# Patient Record
Sex: Male | Born: 1980 | Race: Black or African American | Hispanic: No | Marital: Single | Smoking: Current every day smoker
Health system: Southern US, Community
[De-identification: ages and names within clinical notes are randomized; demographics above are authoritative.]

---

## 2002-07-12 ENCOUNTER — Emergency Department (HOSPITAL_COMMUNITY): Admission: EM | Admit: 2002-07-12 | Discharge: 2002-07-12 | Payer: Self-pay | Admitting: Emergency Medicine

## 2002-07-12 ENCOUNTER — Encounter: Payer: Self-pay | Admitting: Emergency Medicine

## 2006-01-14 ENCOUNTER — Emergency Department (HOSPITAL_COMMUNITY): Admission: EM | Admit: 2006-01-14 | Discharge: 2006-01-14 | Payer: Self-pay | Admitting: Family Medicine

## 2007-02-27 ENCOUNTER — Emergency Department (HOSPITAL_COMMUNITY): Admission: EM | Admit: 2007-02-27 | Discharge: 2007-02-27 | Payer: Self-pay | Admitting: Emergency Medicine

## 2007-03-31 ENCOUNTER — Emergency Department (HOSPITAL_COMMUNITY): Admission: EM | Admit: 2007-03-31 | Discharge: 2007-04-01 | Payer: Self-pay | Admitting: Emergency Medicine

## 2007-04-09 ENCOUNTER — Emergency Department (HOSPITAL_COMMUNITY): Admission: EM | Admit: 2007-04-09 | Discharge: 2007-04-09 | Payer: Self-pay | Admitting: Family Medicine

## 2008-09-30 ENCOUNTER — Emergency Department (HOSPITAL_COMMUNITY): Admission: EM | Admit: 2008-09-30 | Discharge: 2008-09-30 | Payer: Self-pay | Admitting: Emergency Medicine

## 2009-02-10 ENCOUNTER — Emergency Department (HOSPITAL_COMMUNITY): Admission: EM | Admit: 2009-02-10 | Discharge: 2009-02-10 | Payer: Self-pay | Admitting: Emergency Medicine

## 2010-11-17 ENCOUNTER — Ambulatory Visit (INDEPENDENT_AMBULATORY_CARE_PROVIDER_SITE_OTHER): Payer: No Typology Code available for payment source

## 2010-11-17 ENCOUNTER — Observation Stay (HOSPITAL_COMMUNITY)
Admission: EM | Admit: 2010-11-17 | Discharge: 2010-11-19 | Disposition: A | Discharge status: Home / Self Care | Payer: No Typology Code available for payment source | Attending: Orthopedic Surgery | Admitting: Orthopedic Surgery

## 2010-11-17 ENCOUNTER — Inpatient Hospital Stay (INDEPENDENT_AMBULATORY_CARE_PROVIDER_SITE_OTHER)
Admission: RE | Admit: 2010-11-17 | Discharge: 2010-11-17 | Disposition: A | Discharge status: Home / Self Care | Payer: No Typology Code available for payment source | Source: Ambulatory Visit | Attending: Family Medicine | Admitting: Family Medicine

## 2010-11-17 DIAGNOSIS — S63269A Dislocation of metacarpophalangeal joint of unspecified finger, initial encounter: Secondary | ICD-10-CM

## 2010-11-17 DIAGNOSIS — Y998 Other external cause status: Secondary | ICD-10-CM | POA: Insufficient documentation

## 2010-11-17 DIAGNOSIS — F17200 Nicotine dependence, unspecified, uncomplicated: Secondary | ICD-10-CM | POA: Insufficient documentation

## 2010-11-17 DIAGNOSIS — IMO0002 Reserved for concepts with insufficient information to code with codable children: Principal | ICD-10-CM | POA: Insufficient documentation

## 2010-11-17 DIAGNOSIS — F1210 Cannabis abuse, uncomplicated: Secondary | ICD-10-CM | POA: Insufficient documentation

## 2010-11-17 LAB — POCT I-STAT, CHEM 8
Creatinine, Ser: 1.4 mg/dL (ref 0.4–1.5)
Glucose, Bld: 44 mg/dL — CL (ref 70–99)
Hemoglobin: 15 g/dL (ref 13.0–17.0)
Potassium: 3.4 mEq/L — ABNORMAL LOW (ref 3.5–5.1)

## 2010-11-17 LAB — GLUCOSE, CAPILLARY

## 2010-11-18 LAB — BASIC METABOLIC PANEL
BUN: 8 mg/dL (ref 6–23)
Chloride: 104 mEq/L (ref 96–112)
GFR calc Af Amer: >60 mL/min (ref >60)
Potassium: 3.9 mEq/L (ref 3.5–5.1)
Sodium: 138 mEq/L (ref 135–145)

## 2010-11-18 LAB — GLUCOSE, CAPILLARY
Glucose-Capillary: 81 mg/dL (ref 70–99)
Glucose-Capillary: 103 mg/dL — ABNORMAL HIGH (ref 70–99)
Glucose-Capillary: 114 mg/dL — ABNORMAL HIGH (ref 70–99)

## 2010-11-18 LAB — CBC
MCV: 90.8 fL (ref 78.0–100.0)
Platelets: 247 10*3/uL (ref 150–400)
RBC: 3.82 MIL/uL — ABNORMAL LOW (ref 4.22–5.81)
WBC: 7.8 10*3/uL (ref 4.0–10.5)
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
## 2010-11-27 ENCOUNTER — Ambulatory Visit: Payer: No Typology Code available for payment source | Attending: Orthopedic Surgery | Admitting: Occupational Therapy

## 2010-11-27 DIAGNOSIS — IMO0001 Reserved for inherently not codable concepts without codable children: Secondary | ICD-10-CM | POA: Insufficient documentation

## 2010-11-27 DIAGNOSIS — M25549 Pain in joints of unspecified hand: Secondary | ICD-10-CM | POA: Insufficient documentation

## 2010-11-27 DIAGNOSIS — M6281 Muscle weakness (generalized): Secondary | ICD-10-CM | POA: Insufficient documentation

## 2010-11-27 DIAGNOSIS — M2560 Stiffness of unspecified joint, not elsewhere classified: Secondary | ICD-10-CM | POA: Insufficient documentation

## 2010-11-28 ENCOUNTER — Ambulatory Visit: Payer: No Typology Code available for payment source | Admitting: Occupational Therapy

## 2010-12-04 ENCOUNTER — Encounter: Payer: No Typology Code available for payment source | Admitting: Occupational Therapy

## 2010-12-06 ENCOUNTER — Ambulatory Visit: Payer: No Typology Code available for payment source | Admitting: Occupational Therapy

## 2010-12-12 ENCOUNTER — Encounter: Payer: No Typology Code available for payment source | Admitting: Occupational Therapy

## 2010-12-14 ENCOUNTER — Encounter: Payer: No Typology Code available for payment source | Admitting: Occupational Therapy

## 2010-12-18 ENCOUNTER — Ambulatory Visit: Payer: Self-pay | Attending: Orthopedic Surgery | Admitting: Occupational Therapy

## 2010-12-18 DIAGNOSIS — M2560 Stiffness of unspecified joint, not elsewhere classified: Secondary | ICD-10-CM | POA: Insufficient documentation

## 2010-12-18 DIAGNOSIS — M25549 Pain in joints of unspecified hand: Secondary | ICD-10-CM | POA: Insufficient documentation

## 2010-12-18 DIAGNOSIS — M6281 Muscle weakness (generalized): Secondary | ICD-10-CM | POA: Insufficient documentation

## 2010-12-18 DIAGNOSIS — IMO0001 Reserved for inherently not codable concepts without codable children: Secondary | ICD-10-CM | POA: Insufficient documentation

## 2010-12-20 ENCOUNTER — Encounter: Payer: No Typology Code available for payment source | Admitting: Occupational Therapy

## 2010-12-20 ENCOUNTER — Ambulatory Visit: Payer: Self-pay | Admitting: Occupational Therapy

## 2010-12-25 ENCOUNTER — Ambulatory Visit: Payer: Self-pay | Admitting: Occupational Therapy

## 2010-12-26 ENCOUNTER — Encounter: Payer: No Typology Code available for payment source | Admitting: Occupational Therapy

## 2010-12-28 ENCOUNTER — Encounter: Payer: Self-pay | Admitting: Occupational Therapy

## 2010-12-28 ENCOUNTER — Encounter: Payer: No Typology Code available for payment source | Admitting: Occupational Therapy

## 2011-01-04 ENCOUNTER — Ambulatory Visit: Payer: Self-pay | Admitting: Occupational Therapy

## 2011-01-07 ENCOUNTER — Inpatient Hospital Stay (INDEPENDENT_AMBULATORY_CARE_PROVIDER_SITE_OTHER)
Admission: RE | Admit: 2011-01-07 | Discharge: 2011-01-07 | Disposition: A | Discharge status: Home / Self Care | Payer: Self-pay | Source: Ambulatory Visit | Attending: Family Medicine | Admitting: Family Medicine

## 2011-01-07 DIAGNOSIS — J029 Acute pharyngitis, unspecified: Secondary | ICD-10-CM

## 2011-01-11 ENCOUNTER — Encounter: Payer: Self-pay | Admitting: Occupational Therapy

## 2011-01-12 ENCOUNTER — Encounter: Payer: Self-pay | Admitting: Occupational Therapy

## 2011-01-15 ENCOUNTER — Encounter: Payer: Self-pay | Admitting: Occupational Therapy

## 2011-01-16 ENCOUNTER — Encounter: Payer: Self-pay | Admitting: Occupational Therapy
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
## 2014-06-08 ENCOUNTER — Telehealth: Payer: Self-pay | Admitting: Pulmonary Disease

## 2014-06-08 NOTE — Telephone Encounter (Signed)
error 

## 2014-10-13 ENCOUNTER — Encounter (HOSPITAL_COMMUNITY): Payer: Self-pay | Admitting: Emergency Medicine

## 2014-10-13 ENCOUNTER — Emergency Department (HOSPITAL_COMMUNITY)
Admission: EM | Admit: 2014-10-13 | Discharge: 2014-10-13 | Disposition: A | Discharge status: Home / Self Care | Payer: Self-pay | Attending: Emergency Medicine | Admitting: Emergency Medicine

## 2014-10-13 ENCOUNTER — Emergency Department (HOSPITAL_COMMUNITY): Payer: Self-pay

## 2014-10-13 DIAGNOSIS — K529 Noninfective gastroenteritis and colitis, unspecified: Secondary | ICD-10-CM | POA: Insufficient documentation

## 2014-10-13 DIAGNOSIS — Z720 Tobacco use: Secondary | ICD-10-CM | POA: Insufficient documentation

## 2014-10-13 LAB — CBC WITH DIFFERENTIAL/PLATELET
BASOS ABS: 0 10*3/uL (ref 0.0–0.1)
Basophils Relative: 0 % (ref 0–1)
EOS ABS: 0.1 10*3/uL (ref 0.0–0.7)
EOS PCT: 1 % (ref 0–5)
HEMATOCRIT: 38.2 % — AB (ref 39.0–52.0)
HEMOGLOBIN: 12.9 g/dL — AB (ref 13.0–17.0)
LYMPHS PCT: 22 % (ref 12–46)
Lymphs Abs: 2.1 10*3/uL (ref 0.7–4.0)
MCH: 31.2 pg (ref 26.0–34.0)
MCHC: 33.8 g/dL (ref 30.0–36.0)
MCV: 92.3 fL (ref 78.0–100.0)
MONO ABS: 1.2 10*3/uL — AB (ref 0.1–1.0)
MONOS PCT: 12 % (ref 3–12)
NEUTROS PCT: 65 % (ref 43–77)
Neutro Abs: 6.4 10*3/uL (ref 1.7–7.7)
PLATELETS: 238 10*3/uL (ref 150–400)
RBC: 4.14 MIL/uL — ABNORMAL LOW (ref 4.22–5.81)
RDW: 12.5 % (ref 11.5–15.5)
WBC: 9.8 10*3/uL (ref 4.0–10.5)

## 2014-10-13 LAB — COMPREHENSIVE METABOLIC PANEL
ALBUMIN: 4.1 g/dL (ref 3.5–5.2)
ALT: 12 U/L (ref 0–53)
AST: 18 U/L (ref 0–37)
Alkaline Phosphatase: 53 U/L (ref 39–117)
Anion gap: 9 (ref 5–15)
BUN: 7 mg/dL (ref 6–23)
CALCIUM: 9.1 mg/dL (ref 8.4–10.5)
CO2: 29 mmol/L (ref 19–32)
CREATININE: 1.19 mg/dL (ref 0.50–1.35)
Chloride: 98 mmol/L (ref 96–112)
GFR, EST NON AFRICAN AMERICAN: 79 mL/min — AB (ref >90)
Glucose, Bld: 101 mg/dL — ABNORMAL HIGH (ref 70–99)
Potassium: 3.7 mmol/L (ref 3.5–5.1)
SODIUM: 136 mmol/L (ref 135–145)
TOTAL PROTEIN: 7.5 g/dL (ref 6.0–8.3)
Total Bilirubin: 1.1 mg/dL (ref 0.3–1.2)

## 2014-10-13 LAB — URINALYSIS, ROUTINE W REFLEX MICROSCOPIC
GLUCOSE, UA: NEGATIVE mg/dL
HGB URINE DIPSTICK: NEGATIVE
Ketones, ur: 15 mg/dL — AB
Nitrite: NEGATIVE
PH: 5.5 (ref 5.0–8.0)
Protein, ur: 30 mg/dL — AB
SPECIFIC GRAVITY, URINE: 1.028 (ref 1.005–1.030)
UROBILINOGEN UA: 1 mg/dL (ref 0.0–1.0)

## 2014-10-13 LAB — LIPASE, BLOOD: Lipase: 19 U/L (ref 11–59)

## 2014-10-13 LAB — URINE MICROSCOPIC-ADD ON

## 2014-10-13 MED ORDER — METRONIDAZOLE 500 MG PO TABS: 500.0000 mg | ORAL_TABLET | Freq: Three times a day (TID) | ORAL | Status: DC

## 2014-10-13 MED ORDER — IOHEXOL 300 MG/ML  SOLN: 25.0000 mL | Freq: Once | INTRAMUSCULAR | Status: DC | PRN

## 2014-10-13 MED ORDER — ONDANSETRON HCL 4 MG/2ML IJ SOLN
4.0000 mg | Freq: Once | INTRAMUSCULAR | Status: AC
  Administered 2014-10-13: 4 mg via INTRAVENOUS
  Filled 2014-10-13 (×2): qty 2

## 2014-10-13 MED ORDER — CIPROFLOXACIN HCL 500 MG PO TABS
500.0000 mg | ORAL_TABLET | Freq: Once | ORAL | Status: AC
  Administered 2014-10-13: 500 mg via ORAL
  Filled 2014-10-13: qty 1

## 2014-10-13 MED ORDER — HYDROCODONE-ACETAMINOPHEN 5-325 MG PO TABS: 1.0000 | ORAL_TABLET | Freq: Four times a day (QID) | ORAL | Status: DC | PRN

## 2014-10-13 MED ORDER — MORPHINE SULFATE 4 MG/ML IJ SOLN
4.0000 mg | Freq: Once | INTRAMUSCULAR | Status: AC
  Administered 2014-10-13: 4 mg via INTRAVENOUS
  Filled 2014-10-13 (×2): qty 1

## 2014-10-13 MED ORDER — METRONIDAZOLE 500 MG PO TABS
500.0000 mg | ORAL_TABLET | Freq: Once | ORAL | Status: AC
  Administered 2014-10-13: 500 mg via ORAL
  Filled 2014-10-13: qty 1

## 2014-10-13 MED ORDER — CIPROFLOXACIN HCL 500 MG PO TABS: 500.0000 mg | ORAL_TABLET | Freq: Two times a day (BID) | ORAL | Status: DC

## 2014-10-13 MED ORDER — IOHEXOL 300 MG/ML  SOLN
80.0000 mL | Freq: Once | INTRAMUSCULAR | Status: AC | PRN
  Administered 2014-10-13: 1 mL via INTRAVENOUS

## 2014-10-13 NOTE — ED Notes (Addendum)
Pt states on Monday night he started having lower abd pain mostly of the left lower side, radiating into lower back that started yesterday. vomittied once this morning.pt also states he feels like he hasnt been urinating as much as he normally does. Only twice in the last two days.

## 2014-10-13 NOTE — ED Provider Notes (Signed)
CSN: [id number]     Arrival date & time 10/13/14  [id number] History   First MD Initiated Contact with Patient 10/13/14 [id number]     Chief Complaint  Patient presents with  . Abdominal Pain     (Consider location/radiation/quality/duration/timing/severity/associated sxs/prior Treatment) HPI Sergio Edwards is a 34 y.o. male with no medical problems presents to ED with complaint of abdominal pain. States pain started 2 days ago. Worsened last night. Pain in the left lower abdomen. Associated nausea, one episode of vomiting. Normal bowel movement yesterday. Normal urinations with no dysuria, hematuria, frequency or urgency. No testicular pain. Denies fever or chills. No prior abdominal surgeries. Took goodys powder which helped some yesterday. No prior similar pains.   History reviewed. No pertinent past medical history. History reviewed. No pertinent past surgical history. No family history on file. History  Substance Use Topics  . Smoking status: Current Every Day Smoker -- 0.50 packs/day    Types: Cigarettes  . Smokeless tobacco: Not on file  . Alcohol Use: 1.2 oz/week    2 Cans of beer per week    Review of Systems  Constitutional: Negative for fever and chills.  Respiratory: Negative for cough, chest tightness and shortness of breath.   Cardiovascular: Negative for chest pain, palpitations and leg swelling.  Gastrointestinal: Positive for nausea, vomiting and abdominal pain. Negative for diarrhea, constipation, blood in stool and abdominal distention.  Genitourinary: Negative for dysuria, urgency, frequency and hematuria.  Musculoskeletal: Negative for myalgias, arthralgias, neck pain and neck stiffness.  Skin: Negative for rash.  Allergic/Immunologic: Negative for immunocompromised state.  Neurological: Negative for dizziness, weakness, light-headedness, numbness and headaches.  All other systems reviewed and are negative.     Allergies  Review of patient's allergies indicates no  known allergies.  Home Medications   Prior to Admission medications   Not on File   BP 133/79 mmHg  Pulse 75  Temp(Src) 97.9 F (36.6 C) (Oral)  Resp 16  Ht 5\' 7"  (1.702 m)  Wt 135 lb (61.236 kg)  BMI 21.14 kg/m2  SpO2 98% Physical Exam  Constitutional: He is oriented to person, place, and time. He appears well-developed and well-nourished. No distress.  HENT:  Head: Normocephalic and atraumatic.  Eyes: Conjunctivae are normal.  Neck: Neck supple.  Cardiovascular: Normal rate, regular rhythm and normal heart sounds.   Pulmonary/Chest: Effort normal. No respiratory distress. He has no wheezes. He has no rales.  Abdominal: Soft. Bowel sounds are normal. He exhibits no distension and no mass. There is tenderness. There is no rebound.   Left lower quadrant tenderness with mild guarding  Musculoskeletal: He exhibits no edema.  Neurological: He is alert and oriented to person, place, and time.  Skin: Skin is warm and dry.  Nursing note and vitals reviewed.   ED Course  Procedures (including critical care time) Labs Review Labs Reviewed  CBC WITH DIFFERENTIAL/PLATELET - Abnormal; Notable for the following:    RBC 4.14 (*)    Hemoglobin 12.9 (*)    HCT 38.2 (*)    Monocytes Absolute 1.2 (*)    All other components within normal limits  COMPREHENSIVE METABOLIC PANEL - Abnormal; Notable for the following:    Glucose, Bld 101 (*)    GFR calc non Af Amer 79 (*)    All other components within normal limits  URINALYSIS, ROUTINE W REFLEX MICROSCOPIC - Abnormal; Notable for the following:    Color, Urine AMBER (*)    Bilirubin Urine SMALL (*)  Ketones, ur 15 (*)    Protein, ur 30 (*)    Leukocytes, UA TRACE (*)    All other components within normal limits  LIPASE, BLOOD  URINE MICROSCOPIC-ADD ON    Imaging Review Ct Abdomen Pelvis W Contrast  10/13/2014   CLINICAL DATA:  Left lower quadrant pain for 2 days  EXAM: CT ABDOMEN AND PELVIS WITH CONTRAST  TECHNIQUE:  Multidetector CT imaging of the abdomen and pelvis was performed using the standard protocol following bolus administration of intravenous contrast.  CONTRAST:  1mL OMNIPAQUE IOHEXOL 300 MG/ML  SOLN  COMPARISON:  None.  FINDINGS: Lung bases are unremarkable. Sagittal images of the spine are unremarkable.  Enhanced liver, pancreas, spleen and adrenal glands are unremarkable. No calcified gallstones are noted within gallbladder. Abdominal aorta is unremarkable. No aortic aneurysm.  Enhanced kidneys are symmetrical in size. No hydronephrosis or hydroureter.  Normal retrocecal appendix clearly visualize in axial image 60. No pericecal inflammation.  Few diverticula are noted in descending colon. In axial image 57 there is focal thickening or colonic wall in distal left colon medial aspect of the colon. There is small amount of pericolonic fluid and significant strand of pericolonic fat. Findings are consistent with significant focal diverticulitis or colitis. No definite extraluminal air is noted.  There is moderate gas in distal sigmoid colon and rectum. No distal colonic obstruction. Prostate gland and seminal vesicles are unremarkable. There is a low lying cecum.  IMPRESSION: 1. There is focal inflammatory process in distal left colon left lower quadrant with thickening of medial wall of the colon in left lower quadrant see axial image 57. There is small amount of pericolonic fluid and significant pericolonic stranding is noted. Findings are consistent with significant focal diverticulitis or colitis. No definite extraluminal air is noted to suggest a perforation. 2. Normal appendix. There is a low lying cecum. No pericecal inflammation. 3. Moderate gas noted in distal sigmoid colon and rectum. No distal colonic obstruction. 4. No small bowel obstruction. 5. No hydronephrosis or hydroureter.   Electronically Signed   By: Natasha Mead M.D.   On: 10/13/2014 12:28     EKG Interpretation None      MDM   Final  diagnoses:  Colitis     patient with abdominal pain  For 2 days. Pain mainly in the left lower quadrant. Abdomen is tender, some guarding noted on exam. Patient otherwise nontoxic appearing. Vital signs are normal. Will get blood tests, pain medications ordered.  12:39 PM  CT obtained due to guarding on exam and significant tenderness. CT as described above, showing focal diverticulitis versus colitis. Will place patient on Cipro Flagyl, Norco for pain at home. Follow-up with primary care doctor or with gastroenterology. Patient is tolerating by mouth fluids in emergency department. First does of medications given here. Patient is stable for outpatient treatment and follow-up, return precautions discussed.'  Filed Vitals:   10/13/14 1117 10/13/14 1130 10/13/14 1145 10/13/14 1305  BP: 110/65 116/77 121/79 112/70  Pulse: 75 87 70 84  Temp:    97.9 F (36.6 C)  TempSrc:    Oral  Resp: Height:      Weight:      SpO2: 99% 98% 98% 100%     Lottie Mussel, PA-C 10/13/14 1322  Gwyneth Sprout, MD 10/13/14 1512

## 2014-10-13 NOTE — Discharge Instructions (Signed)
cirpo and flagyl for infection until all gone. Norco for severe pain. Follow up with your family doctor or gastroenterology. Return if worsening symptoms.    Colitis Colitis is inflammation of the colon. Colitis can be a short-term or long-standing (chronic) illness. Crohn's disease and ulcerative colitis are 2 types of colitis which are chronic. They usually require lifelong treatment. CAUSES  There are many different causes of colitis, including:  Viruses.  Germs (bacteria).  Medicine reactions. SYMPTOMS   Diarrhea.  Intestinal bleeding.  Pain.  Fever.  Throwing up (vomiting).  Tiredness (fatigue).  Weight loss.  Bowel blockage. DIAGNOSIS  The diagnosis of colitis is based on examination and stool or blood tests. X-rays, CT scan, and colonoscopy may also be needed. TREATMENT  Treatment may include:  Fluids given through the vein (intravenously).  Bowel rest (nothing to eat or drink for a period of time).  Medicine for pain and diarrhea.  Medicines (antibiotics) that kill germs.  Cortisone medicines.  Surgery. HOME CARE INSTRUCTIONS   Get plenty of rest.  Drink enough water and fluids to keep your urine clear or pale yellow.  Eat a well-balanced diet.  Call your caregiver for follow-up as recommended. SEEK IMMEDIATE MEDICAL CARE IF:   You develop chills.  You have an oral temperature above 102 F (38.9 C), not controlled by medicine.  You have extreme weakness, fainting, or dehydration.  You have repeated vomiting.  You develop severe belly (abdominal) pain or are passing bloody or tarry stools. MAKE SURE YOU:   Understand these instructions.  Will watch your condition.  Will get help right away if you are not doing well or get worse. Document Released: 10/11/2004 Document Revised: 11/26/2011 Document Reviewed: 01/06/2010 [facility] Patient Information 2015 [employer] [place]. This information is not intended to replace advice given to you by  your health care provider. Make sure you discuss any questions you have with your health care provider.

## 2015-08-20 ENCOUNTER — Encounter (HOSPITAL_COMMUNITY): Payer: Self-pay

## 2015-08-20 ENCOUNTER — Emergency Department (HOSPITAL_COMMUNITY)
Admission: EM | Admit: 2015-08-20 | Discharge: 2015-08-20 | Disposition: A | Discharge status: Home / Self Care | Payer: PRIVATE HEALTH INSURANCE | Attending: Emergency Medicine | Admitting: Emergency Medicine

## 2015-08-20 ENCOUNTER — Emergency Department (HOSPITAL_COMMUNITY): Payer: PRIVATE HEALTH INSURANCE

## 2015-08-20 DIAGNOSIS — F17210 Nicotine dependence, cigarettes, uncomplicated: Secondary | ICD-10-CM | POA: Diagnosis not present

## 2015-08-20 DIAGNOSIS — Y9289 Other specified places as the place of occurrence of the external cause: Secondary | ICD-10-CM | POA: Diagnosis not present

## 2015-08-20 DIAGNOSIS — S81801A Unspecified open wound, right lower leg, initial encounter: Secondary | ICD-10-CM | POA: Insufficient documentation

## 2015-08-20 DIAGNOSIS — Y999 Unspecified external cause status: Secondary | ICD-10-CM | POA: Insufficient documentation

## 2015-08-20 DIAGNOSIS — Y9389 Activity, other specified: Secondary | ICD-10-CM | POA: Diagnosis not present

## 2015-08-20 DIAGNOSIS — Z23 Encounter for immunization: Secondary | ICD-10-CM | POA: Diagnosis not present

## 2015-08-20 DIAGNOSIS — S81831A Puncture wound without foreign body, right lower leg, initial encounter: Secondary | ICD-10-CM | POA: Diagnosis present

## 2015-08-20 DIAGNOSIS — S81811A Laceration without foreign body, right lower leg, initial encounter: Secondary | ICD-10-CM

## 2015-08-20 MED ORDER — TETANUS-DIPHTH-ACELL PERTUSSIS 5-2.5-18.5 LF-MCG/0.5 IM SUSP
INTRAMUSCULAR | Status: AC
  Filled 2015-08-20: qty 0.5

## 2015-08-20 MED ORDER — TETANUS-DIPHTH-ACELL PERTUSSIS 5-2.5-18.5 LF-MCG/0.5 IM SUSP
0.5000 mL | Freq: Once | INTRAMUSCULAR | Status: AC
  Administered 2015-08-20: 0.5 mL via INTRAMUSCULAR

## 2015-08-20 MED ORDER — IBUPROFEN 800 MG PO TABS: 800.0000 mg | ORAL_TABLET | Freq: Three times a day (TID) | ORAL | Status: DC

## 2015-08-20 MED ORDER — CEPHALEXIN 500 MG PO CAPS: 500.0000 mg | ORAL_CAPSULE | Freq: Three times a day (TID) | ORAL | Status: DC
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
## 2015-08-20 NOTE — ED Notes (Signed)
MD at bedside; Md preformed wound care and place 2 stables to right outside of right leg

## 2015-08-20 NOTE — Discharge Instructions (Signed)
Keflex daily for 10 days Laceration:  The laceration is a cut or lesion that goes through all layers of the skin and into the tissue just beneath the skin. This may have been repaired by your caregiver with either stitches or a tissue adhesive similar to a super glue.  Please keep your wound clean and dry with a topical antibiotic and a sterile dressing for the next 48 hours. Your wound should be reevaluated by your family doctor within the next 2 days for a recheck. If you do not have a family doctor you may return to the emergency department for a recheck or see the list of followup doctors below.  Seek medical attention if:   There is redness, swelling, increasing pain in the wound  There is a red line that goes up your arm or leg  Pus is coming from the wound  He developed an unexplained temperature above 100.13F  He noticed a foul-smelling coming from the wound or dressing  There is a breaking open of the wound after the sutures have been removed  If you did not receive a tetanus shot today because she thought she were up to date but did not recall when her last one was given, nature to check with her primary caregiver to determine if she needs one.   RESOURCE GUIDE  Dental Problems  Patients with Medicaid: [facility] [phone]                                           [phone] [address] [employer] [address] Phone:  [phone]                                                  Phone:  [phone]  If unable to pay or uninsured, contact:  [facility] or [facility]. to become qualified for the adult dental clinic.  Chronic Pain Problems Contact [facility]  [phone] Patients need to be referred by their primary care doctor.  Insufficient Money for Medicine Contact [employer]:  call "211" or [facility] 743-680-7132.  No Primary Care Doctor Call [facility]  ([phone] Other agencies that  provide inexpensive medical care    [facility]  7125662801    [facility]  [phone]    [facility]  [phone]    [facility]  [phone]    [facility]  [phone]    [facility]  7050567868  Psychological Services [facility]  [phone] [facility] Services  [phone] [facility]   [phone] (emergency services 516-729-4439)  Substance Abuse Resources [facility]  801-116-5282 [facility] (425)734-1725 [facility] 7693322588 Floydene Flock [phone] Residential & Outpatient Substance Abuse Program  (908) 090-8164  Abuse/Neglect [facility] Child Abuse Hotline 863-650-9853 [facility] Child Abuse Hotline 9844401037 (After Hours)  Emergency Shelter [facility] 501 735 0018  M[facility] [place] [phone] [facility] Services [phone]  MRSA Hotline #:   403-038-7684    [facility]  Resources  [facility] [place]     [employer]                          [facility] [address]                       [address]      [address]  [place]                                                [place]                            [place] Phone:  [phone]                                   Phone:  [phone]                 Phone:  [phone]  [facility] Phone:  [phone]  [facility] Child Abuse Hotline 323-144-2424(336) (604)111-4680 336-695-2672(336) 3074947862 (After Hours)
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
## 2015-08-29 ENCOUNTER — Encounter (HOSPITAL_COMMUNITY): Payer: Self-pay | Admitting: Family Medicine

## 2015-08-29 ENCOUNTER — Emergency Department (HOSPITAL_COMMUNITY)
Admission: EM | Admit: 2015-08-29 | Discharge: 2015-08-29 | Disposition: A | Discharge status: Home / Self Care | Payer: PRIVATE HEALTH INSURANCE | Attending: Emergency Medicine | Admitting: Emergency Medicine

## 2015-08-29 DIAGNOSIS — F17210 Nicotine dependence, cigarettes, uncomplicated: Secondary | ICD-10-CM | POA: Insufficient documentation

## 2015-08-29 DIAGNOSIS — Z792 Long term (current) use of antibiotics: Secondary | ICD-10-CM | POA: Insufficient documentation

## 2015-08-29 DIAGNOSIS — Z4802 Encounter for removal of sutures: Secondary | ICD-10-CM
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
## 2015-08-29 NOTE — Discharge Instructions (Signed)
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
## 2015-08-29 NOTE — ED Notes (Signed)
Declined W/C at D/C and was escorted to lobby by RN. 

## 2016-05-09 ENCOUNTER — Encounter (HOSPITAL_COMMUNITY): Payer: Self-pay | Admitting: Emergency Medicine

## 2016-05-09 ENCOUNTER — Emergency Department (HOSPITAL_COMMUNITY): Payer: PRIVATE HEALTH INSURANCE

## 2016-05-09 ENCOUNTER — Emergency Department (HOSPITAL_COMMUNITY)
Admission: EM | Admit: 2016-05-09 | Discharge: 2016-05-09 | Disposition: A | Discharge status: Home / Self Care | Payer: PRIVATE HEALTH INSURANCE | Attending: Emergency Medicine | Admitting: Emergency Medicine

## 2016-05-09 DIAGNOSIS — M545 Low back pain, unspecified: Secondary | ICD-10-CM

## 2016-05-09 DIAGNOSIS — F17210 Nicotine dependence, cigarettes, uncomplicated: Secondary | ICD-10-CM | POA: Insufficient documentation

## 2016-05-09 MED ORDER — NAPROXEN 500 MG PO TABS: 500.0000 mg | ORAL_TABLET | Freq: Two times a day (BID) | ORAL | 0 refills | Status: DC

## 2016-05-09 NOTE — ED Triage Notes (Signed)
Pt. reports chronic left hip pain for 6 months worse these past several days , denies injury or fall / ambulatory .

## 2016-05-09 NOTE — ED Provider Notes (Signed)
[facility]-EMERGENCY DEPT Provider Note   CSN: [id number] Arrival date & time: 05/09/16  [id number]     History   Chief Complaint Chief Complaint  Patient presents with  . Hip Pain    HPI Sergio Edwards is a 35 y.o. male.  The history is provided by the patient.  Back Pain   This is a chronic problem. Episode onset: 6 months ago. The problem occurs every several days. The problem has been gradually worsening. The pain is associated with no known injury. The pain is present in the lumbar spine. The quality of the pain is described as aching. The pain radiates to the left thigh. The pain is mild. The symptoms are aggravated by certain positions. The pain is worse during the day. Pertinent negatives include no fever, no bowel incontinence, no bladder incontinence and no weakness. He has tried NSAIDs (occasional) for the symptoms. The treatment provided no relief. Risk factors include lack of exercise.    History reviewed. No pertinent past medical history.  There are no active problems to display for this patient.   History reviewed. No pertinent surgical history.     Home Medications    Prior to Admission medications   Medication Sig Start Date End Date Taking? Authorizing Provider  cephALEXin (KEFLEX) 500 MG capsule Take 1 capsule (500 mg total) by mouth 3 (three) times daily. 08/20/15   Eber HongBrian Miller, MD  ciprofloxacin (CIPRO) 500 MG tablet Take 1 tablet (500 mg total) by mouth 2 (two) times daily. Patient not taking: Reported on 08/20/2015 10/13/14   Jaynie Crumbleatyana Kirichenko, PA-C  HYDROcodone-acetaminophen (NORCO) 5-325 MG per tablet Take 1 tablet by mouth every 6 (six) hours as needed for moderate pain. Patient not taking: Reported on 08/20/2015 10/13/14   Tatyana Kirichenko, PA-C  ibuprofen (ADVIL,MOTRIN) 800 MG tablet Take 1 tablet (800 mg total) by mouth 3 (three) times daily. 08/20/15   Eber HongBrian Miller, MD  metroNIDAZOLE (FLAGYL) 500 MG tablet Take 1 tablet (500 mg total) by mouth 3 (three)  times daily. Patient not taking: Reported on 08/20/2015 10/13/14   Jaynie Crumbleatyana Kirichenko, PA-C    Family History No family history on file.  Social History Social History  Substance Use Topics  . Smoking status: Current Every Day Smoker    Packs/day: 0.50    Types: Cigarettes  . Smokeless tobacco: Not on file  . Alcohol use 1.2 oz/week    2 Cans of beer per week     Allergies   Review of patient's allergies indicates no known allergies.   Review of Systems Review of Systems  Constitutional: Negative for fever.  Gastrointestinal: Negative for bowel incontinence.  Genitourinary: Negative for bladder incontinence.  Musculoskeletal: Positive for back pain.  Neurological: Negative for weakness.  All other systems reviewed and are negative.    Physical Exam Updated Vital Signs BP 155/92 (BP Location: Left Arm)   Pulse 88   Temp 98.4 F (36.9 C) (Oral)   Resp 16   Ht 5\' 7"  (1.702 m)   Wt 123 lb (55.8 kg)   SpO2 99%   BMI 19.26 kg/m   Physical Exam  Constitutional: He is oriented to person, place, and time. He appears well-developed and well-nourished. No distress.  HENT:  Head: Normocephalic and atraumatic.  Eyes: Conjunctivae are normal.  Neck: Neck supple. No tracheal deviation present.  Cardiovascular: Normal rate and regular rhythm.   Pulmonary/Chest: Effort normal. No respiratory distress.  Abdominal: Soft. He exhibits no distension.  Musculoskeletal:  Lumbar back: He exhibits tenderness and pain. He exhibits normal range of motion and no bony tenderness.       Back:  Negative bilateral leg raise  Neurological: He is alert and oriented to person, place, and time. He has normal strength. Gait normal. GCS eye subscore is 4. GCS verbal subscore is 5. GCS motor subscore is 6.  Skin: Skin is warm and dry.  Psychiatric: He has a normal mood and affect.  Vitals reviewed.    ED Treatments / Results  Labs (all labs ordered are listed, but only abnormal  results are displayed) Labs Reviewed - No data to display  EKG  EKG Interpretation None       Radiology Dg Hip Unilat W Or Wo Pelvis 2-3 Views Left  Result Date: 05/09/2016 CLINICAL DATA:  Pain for 2 weeks EXAM: DG HIP (WITH OR WITHOUT PELVIS) 2-3V LEFT COMPARISON:  None. FINDINGS: Frontal pelvis as well as frontal and lateral left hip images were obtained. There is no fracture or dislocation. The joint spaces appear normal. No erosive change. IMPRESSION: No fracture or dislocation.  No apparent arthropathy. Electronically Signed   By: Bretta BangWilliam  Woodruff III M.D.   On: 05/09/2016 07:10    Procedures Procedures (including critical care time)  Medications Ordered in ED Medications - No data to display   Initial Impression / Assessment and Plan / ED Course  I have reviewed the triage vital signs and the nursing notes.  Pertinent labs & imaging results that were available during my care of the patient were reviewed by me and considered in my medical decision making (see chart for details).  Clinical Course    35 y.o. male presents with back pain in lumbar area since the last 6 months worsening this week without signs of radicular pain. No acute traumatic onset. No red flag symptoms of fever, weight loss, saddle anesthesia, weakness, fecal/urinary incontinence or urinary retention. Suspect MSK etiology. Triage XR negative. Patient was recommended to take short course of scheduled NSAIDs and engage in early mobility as definitive treatment. Return precautions discussed for worsening or new concerning symptoms.    Final Clinical Impressions(s) / ED Diagnoses   Final diagnoses:  Left-sided low back pain without sciatica    New Prescriptions New Prescriptions   NAPROXEN (NAPROSYN) 500 MG TABLET    Take 1 tablet (500 mg total) by mouth 2 (two) times daily with a meal.     Sergio Pulleyaniel Cleveland Yarbro, MD 05/09/16 [phone]

## 2017-07-20 ENCOUNTER — Encounter (HOSPITAL_COMMUNITY): Payer: Self-pay

## 2017-07-20 ENCOUNTER — Emergency Department (HOSPITAL_COMMUNITY)
Admission: EM | Admit: 2017-07-20 | Discharge: 2017-07-20 | Disposition: A | Discharge status: Home / Self Care | Payer: Managed Care, Other (non HMO) | Attending: Emergency Medicine | Admitting: Emergency Medicine

## 2017-07-20 ENCOUNTER — Emergency Department (HOSPITAL_COMMUNITY): Payer: Managed Care, Other (non HMO)

## 2017-07-20 DIAGNOSIS — Y929 Unspecified place or not applicable: Secondary | ICD-10-CM | POA: Insufficient documentation

## 2017-07-20 DIAGNOSIS — M79671 Pain in right foot: Secondary | ICD-10-CM

## 2017-07-20 DIAGNOSIS — Y9389 Activity, other specified: Secondary | ICD-10-CM | POA: Diagnosis not present

## 2017-07-20 DIAGNOSIS — S6991XA Unspecified injury of right wrist, hand and finger(s), initial encounter: Secondary | ICD-10-CM | POA: Diagnosis present

## 2017-07-20 DIAGNOSIS — S50311A Abrasion of right elbow, initial encounter: Secondary | ICD-10-CM | POA: Diagnosis not present

## 2017-07-20 DIAGNOSIS — S80211A Abrasion, right knee, initial encounter: Secondary | ICD-10-CM | POA: Insufficient documentation

## 2017-07-20 DIAGNOSIS — M79641 Pain in right hand: Secondary | ICD-10-CM | POA: Diagnosis not present

## 2017-07-20 DIAGNOSIS — Y999 Unspecified external cause status: Secondary | ICD-10-CM | POA: Insufficient documentation

## 2017-07-20 DIAGNOSIS — X58XXXA Exposure to other specified factors, initial encounter: Secondary | ICD-10-CM | POA: Insufficient documentation

## 2017-07-20 DIAGNOSIS — M25531 Pain in right wrist: Secondary | ICD-10-CM

## 2017-07-20 DIAGNOSIS — F17210 Nicotine dependence, cigarettes, uncomplicated: Secondary | ICD-10-CM | POA: Insufficient documentation

## 2017-07-20 MED ORDER — IBUPROFEN 400 MG PO TABS
ORAL_TABLET | ORAL | Status: AC
  Administered 2017-07-20: 400 mg
  Filled 2017-07-20: qty 1

## 2017-07-20 MED ORDER — IBUPROFEN 400 MG PO TABS: 400.0000 mg | ORAL_TABLET | Freq: Once | ORAL | Status: DC | PRN

## 2017-07-20 MED ORDER — IBUPROFEN 800 MG PO TABS: 800.0000 mg | ORAL_TABLET | Freq: Three times a day (TID) | ORAL | 0 refills | Status: DC

## 2017-07-20 NOTE — ED Notes (Signed)
Pt verbalizes understanding of d/c instructions. Pt received prescriptions. Pt ambulatory at d/c with all belongings.  
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
## 2017-07-20 NOTE — ED Notes (Signed)
Ortho tech at bedside 

## 2017-07-20 NOTE — Discharge Instructions (Signed)
1. Medications: alternate naprosyn and tylenol for pain control, usual home medications 2. Treatment: rest, ice, elevate and use brace, drink plenty of fluids, gentle stretching 3. Follow Up: Please followup with hand surgery for discussion of your diagnoses and further evaluation after today's visit; if you do not have a primary care doctor use the resource guide provided to find one; Please return to the ER for worsening symptoms or other concerns
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
## 2017-07-20 NOTE — ED Notes (Signed)
ED Provider at bedside. 

## 2017-07-20 NOTE — Progress Notes (Signed)
Orthopedic Tech Progress Note Patient Details:  Sergio MurrainDaniel L Edwards 06-22-1981 [id number]  Ortho Devices Type of Ortho Device: Ulna gutter splint, Postop shoe/boot Ortho Device/Splint Location: rue ulna gutter, rle post op shoe Ortho Device/Splint Interventions: Ordered, Application, Adjustment   Trinna PostMartinez, Lashaunda Schild J 07/20/2017, 11:21 PM

## 2018-03-05 ENCOUNTER — Encounter (HOSPITAL_COMMUNITY): Payer: Self-pay | Admitting: Urgent Care

## 2018-03-05 ENCOUNTER — Ambulatory Visit (HOSPITAL_COMMUNITY)
Admission: EM | Admit: 2018-03-05 | Discharge: 2018-03-05 | Disposition: A | Discharge status: Home / Self Care | Payer: Managed Care, Other (non HMO) | Attending: Internal Medicine | Admitting: Internal Medicine

## 2018-03-05 DIAGNOSIS — R1032 Left lower quadrant pain: Secondary | ICD-10-CM | POA: Diagnosis not present

## 2018-03-05 MED ORDER — IBUPROFEN 800 MG PO TABS: 800.0000 mg | ORAL_TABLET | Freq: Three times a day (TID) | ORAL | 0 refills | Status: DC

## 2018-03-05 NOTE — Discharge Instructions (Addendum)
Use anti-inflammatories for pain/swelling. You may take up to 800 mg Ibuprofen every 8 hours with food. You may supplement Ibuprofen with Tylenol [phone] mg every 8 hours.   Please return if pain not improving with using anti-inflammatories, worsening, developing new symptoms

## 2018-03-05 NOTE — ED Triage Notes (Signed)
Pt here for 3 days of lower abd pain, more on the left side. The pain is worse with movement. He denies any N,V,D. He denies fever, constipation, urinary symptoms or concern for STDs.

## 2018-03-05 NOTE — ED Provider Notes (Signed)
[facility]    CSN: [id number] Arrival date & time: 03/05/18  1005     History   Chief Complaint Chief Complaint  Patient presents with  . Abdominal Pain    HPI YANIEL LIMBAUGH is a 37 y.o. male presenting today for evaluation of left lower abdominal pain.  Patient states that symptoms began 3 days ago, he noticed this on Sunday.  He is not doing anything in particular.  Worse with movement, worse when he goes to lie flat, with walking or lifting heavy things.  Denies any nausea, vomiting, diarrhea.  Notes his bowel movements have been normal, last bowel movement was this morning and was solid.  Has regular daily bowel movements.  Eating and drinking like normal.  Denies urinary symptoms of dysuria, penile discharge, concern for STDs.  States he was recently checked for STDs and has not had any new partners.  He has not taken anything for symptoms.  HPI  History reviewed. No pertinent past medical history.  There are no active problems to display for this patient.   History reviewed. No pertinent surgical history.     Home Medications    Prior to Admission medications   Medication Sig Start Date End Date Taking? Authorizing Provider  ibuprofen (ADVIL,MOTRIN) 800 MG tablet Take 1 tablet (800 mg total) by mouth 3 (three) times daily. 03/05/18   Samiyyah Moffa, Junius Creamer, PA-C    Family History History reviewed. No pertinent family history.  Social History Social History   Tobacco Use  . Smoking status: Current Every Day Smoker    Packs/day: 0.50    Types: Cigarettes  . Smokeless tobacco: Never Used  Substance Use Topics  . Alcohol use: Yes    Alcohol/week: 1.2 oz    Types: 2 Cans of beer per week    Comment: last alcohol 4pm 07-20-17   . Drug use: Yes    Types: Cocaine    Comment: last used cocaine 07-18-17     Allergies   Patient has no known allergies.   Review of Systems Review of Systems  Constitutional: Negative for fever.  HENT: Negative for  sore throat.   Respiratory: Negative for shortness of breath.   Cardiovascular: Negative for chest pain.  Gastrointestinal: Positive for abdominal pain. Negative for blood in stool, constipation, diarrhea, nausea and vomiting.  Genitourinary: Negative for difficulty urinating, discharge, dysuria, frequency, penile pain, penile swelling, scrotal swelling and testicular pain.  Skin: Negative for rash.  Neurological: Negative for dizziness, light-headedness and headaches.     Physical Exam Triage Vital Signs ED Triage Vitals  Enc Vitals Group     BP 03/05/18 1053 (!) 133/91     Pulse Rate 03/05/18 1053 85     Resp 03/05/18 1053 18     Temp 03/05/18 1053 98.8 F (37.1 C)     Temp src --      SpO2 03/05/18 1053 100 %     Weight --      Height --      Head Circumference --      Peak Flow --      Pain Score 03/05/18 1055 5     Pain Loc --      Pain Edu? --      Excl. in GC? --    No data found.  Updated Vital Signs BP (!) 133/91   Pulse 85   Temp 98.8 F (37.1 C)   Resp 18   SpO2 100%   Visual Acuity  Right Eye Distance:   Left Eye Distance:   Bilateral Distance:    Right Eye Near:   Left Eye Near:    Bilateral Near:     Physical Exam  Constitutional: He appears well-developed and well-nourished.  Comfortably sitting on exam table, he easily moves from chair to exam table.  HENT:  Head: Normocephalic and atraumatic.  Eyes: Conjunctivae are normal.  Neck: Neck supple.  Cardiovascular: Normal rate and regular rhythm.  No murmur heard. Pulmonary/Chest: Effort normal and breath sounds normal. No respiratory distress.  Abdominal: Soft. There is no tenderness.  Abdomen soft, nondistended, bowel sounds present in all 4 quadrants, tympanic to percussion throughout abdomen, tenderness to palpation of left lower quadrant, negative rebound, negative Rovsing, negative McBurney's.  Patient does have pain while lying back but once he is lying flat he his pain is minimal.   Negative CVA tenderness.  Musculoskeletal: He exhibits no edema.  Neurological: He is alert.  Skin: Skin is warm and dry.  Psychiatric: He has a normal mood and affect.  Nursing note and vitals reviewed.    UC Treatments / Results  Labs (all labs ordered are listed, but only abnormal results are displayed) Labs Reviewed - No data to display  EKG None  Radiology No results found.  Procedures Procedures (including critical care time)  Medications Ordered in UC Medications - No data to display  Initial Impression / Assessment and Plan / UC Course  I have reviewed the triage vital signs and the nursing notes.  Pertinent labs & imaging results that were available during my care of the patient were reviewed by me and considered in my medical decision making (see chart for details).     Pain seems to be more muscular in origin versus GI or acute abdominal pathology.  At this time will recommend anti-inflammatories.  Follow-up if symptoms worsening or developing new symptoms with this pain.Discussed strict return precautions. Patient verbalized understanding and is agreeable with plan.  Final Clinical Impressions(s) / UC Diagnoses   Final diagnoses:  Left lower quadrant pain     Discharge Instructions     Use anti-inflammatories for pain/swelling. You may take up to 800 mg Ibuprofen every 8 hours with food. You may supplement Ibuprofen with Tylenol [phone] mg every 8 hours.   Please return if pain not improving with using anti-inflammatories, worsening, developing new symptoms   ED Prescriptions    Medication Sig Dispense Auth. Provider   ibuprofen (ADVIL,MOTRIN) 800 MG tablet Take 1 tablet (800 mg total) by mouth 3 (three) times daily. 30 tablet Beckett Hickmon, SeeleyHallie C, PA-C     Controlled Substance Prescriptions [place] Controlled Substance Registry consulted? Not Applicable   Lew DawesWieters, Belinda Bringhurst C, [place] JerseyPA-C 03/05/18 1122

## 2019-01-30 ENCOUNTER — Encounter (HOSPITAL_COMMUNITY): Payer: Self-pay | Admitting: Emergency Medicine

## 2019-01-30 ENCOUNTER — Other Ambulatory Visit: Payer: Self-pay

## 2019-01-30 ENCOUNTER — Emergency Department (HOSPITAL_COMMUNITY): Payer: Self-pay

## 2019-01-30 ENCOUNTER — Emergency Department (HOSPITAL_COMMUNITY)
Admission: EM | Admit: 2019-01-30 | Discharge: 2019-01-30 | Disposition: A | Discharge status: Home / Self Care | Payer: Self-pay | Attending: Emergency Medicine | Admitting: Emergency Medicine

## 2019-01-30 DIAGNOSIS — Y929 Unspecified place or not applicable: Secondary | ICD-10-CM | POA: Insufficient documentation

## 2019-01-30 DIAGNOSIS — Y939 Activity, unspecified: Secondary | ICD-10-CM | POA: Insufficient documentation

## 2019-01-30 DIAGNOSIS — Y999 Unspecified external cause status: Secondary | ICD-10-CM | POA: Insufficient documentation

## 2019-01-30 DIAGNOSIS — Z23 Encounter for immunization: Secondary | ICD-10-CM | POA: Insufficient documentation

## 2019-01-30 DIAGNOSIS — M25441 Effusion, right hand: Secondary | ICD-10-CM | POA: Insufficient documentation

## 2019-01-30 DIAGNOSIS — F17210 Nicotine dependence, cigarettes, uncomplicated: Secondary | ICD-10-CM | POA: Insufficient documentation

## 2019-01-30 DIAGNOSIS — S61412A Laceration without foreign body of left hand, initial encounter: Secondary | ICD-10-CM | POA: Insufficient documentation

## 2019-01-30 MED ORDER — LIDOCAINE-EPINEPHRINE (PF) 2 %-1:200000 IJ SOLN
10.0000 mL | Freq: Once | INTRAMUSCULAR | Status: AC
  Administered 2019-01-30: 10 mL
  Filled 2019-01-30: qty 10

## 2019-01-30 MED ORDER — CEPHALEXIN 500 MG PO CAPS: 500.0000 mg | ORAL_CAPSULE | Freq: Three times a day (TID) | ORAL | 0 refills | Status: DC

## 2019-01-30 MED ORDER — TETANUS-DIPHTH-ACELL PERTUSSIS 5-2.5-18.5 LF-MCG/0.5 IM SUSP
0.5000 mL | Freq: Once | INTRAMUSCULAR | Status: AC
  Administered 2019-01-30: 0.5 mL via INTRAMUSCULAR
  Filled 2019-01-30: qty 0.5

## 2019-01-30 MED ORDER — CEPHALEXIN 500 MG PO CAPS
500.0000 mg | ORAL_CAPSULE | Freq: Once | ORAL | Status: AC
  Administered 2019-01-30: 500 mg via ORAL
  Filled 2019-01-30: qty 1
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
## 2019-01-30 NOTE — Discharge Instructions (Signed)
Please read and follow all provided instructions.  Your diagnoses today include:  1. Laceration of left hand without foreign body, initial encounter   2. Finger joint swelling, right     Tests performed today include:  X-ray of the affected area that did not show any foreign bodies or broken bones  Vital signs. See below for your results today.   Medications prescribed:   Keflex (cephalexin) - antibiotic  You have been prescribed an antibiotic medicine: take the entire course of medicine even if you are feeling better. Stopping early can cause the antibiotic not to work.  Take any prescribed medications only as directed.   Home care instructions:  Follow any educational materials and wound care instructions contained in this packet.   Keep affected area above the level of your heart when possible to minimize swelling. Wash area gently twice a day with warm soapy water. Do not apply alcohol or hydrogen peroxide. Cover the area if it draining or weeping.   Follow-up instructions: Suture Removal: Return to the Emergency Department or see your primary care care doctor in 10-14 days for a recheck of your wound and removal of your sutures or staples.    Return instructions:  Return to the Emergency Department if you have:  Fever  Worsening pain  Worsening swelling of the wound  Pus draining from the wound  Redness of the skin that moves away from the wound, especially if it streaks away from the affected area   Any other emergent concerns  Your vital signs today were: BP (!) 140/99 (BP Location: Right Arm)    Pulse (!) 120    Temp 98.3 F (36.8 C) (Oral)    Resp 18    SpO2 99%  If your blood pressure (BP) was elevated above 135/85 this visit, please have this repeated by your doctor within one month. --------------

## 2019-01-30 NOTE — ED Notes (Signed)
Attempted to give discharge instructions. Pt not in room and unable to locate

## 2019-02-12 ENCOUNTER — Encounter (HOSPITAL_COMMUNITY): Payer: Self-pay

## 2019-02-12 ENCOUNTER — Emergency Department (HOSPITAL_COMMUNITY)
Admission: EM | Admit: 2019-02-12 | Discharge: 2019-02-12 | Disposition: A | Discharge status: Home / Self Care | Payer: Self-pay | Attending: Emergency Medicine | Admitting: Emergency Medicine

## 2019-02-12 ENCOUNTER — Other Ambulatory Visit: Payer: Self-pay

## 2019-02-12 DIAGNOSIS — Z4802 Encounter for removal of sutures: Secondary | ICD-10-CM

## 2019-02-12 DIAGNOSIS — F17210 Nicotine dependence, cigarettes, uncomplicated: Secondary | ICD-10-CM | POA: Insufficient documentation

## 2019-02-12 DIAGNOSIS — X58XXXD Exposure to other specified factors, subsequent encounter: Secondary | ICD-10-CM | POA: Insufficient documentation

## 2019-02-12 DIAGNOSIS — Y999 Unspecified external cause status: Secondary | ICD-10-CM | POA: Insufficient documentation

## 2019-02-12 DIAGNOSIS — S61412D Laceration without foreign body of left hand, subsequent encounter: Secondary | ICD-10-CM | POA: Insufficient documentation

## 2019-02-12 NOTE — Discharge Instructions (Addendum)
Your wound appears to be healing well. Continue to keep the area clean and dry.   May apply ointments or lotions such as Aquaphor to the area to reduce scarring. The key is to keep the skin well hydrated and supple. It is also important to stay well hydrated by drinking plenty of water. Keep your scar protected from the sun. Cover the scar with sunscreen that has an SPF (sun protection factor) of 30 or higher. Do not put sunscreen on your scar until it has healed. Gently massage the scar using a circular motion. This will help to minimize the appearance of the scar. Do this only after the incision has closed and all of the sutures have been removed. Remember that the scar may appear lighter or darker than your normal skin color. This difference in color should even out with time. If your scar does not fade or go away with time and you do not like how it looks, consider talking with a plastic surgeon or a dermatologist.  

## 2019-02-12 NOTE — ED Triage Notes (Signed)
Pt had sutures done approx 13 days ago to the left thumb, now needing to be removed.
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
## 2019-07-21 ENCOUNTER — Other Ambulatory Visit: Payer: Self-pay

## 2019-07-21 DIAGNOSIS — Z20822 Contact with and (suspected) exposure to covid-19: Secondary | ICD-10-CM

## 2019-07-22 LAB — NOVEL CORONAVIRUS, NAA: SARS-CoV-2, NAA: NOT DETECTED

## 2020-07-04 ENCOUNTER — Ambulatory Visit: Payer: Self-pay

## 2021-03-18 ENCOUNTER — Emergency Department (HOSPITAL_COMMUNITY)
Admission: EM | Admit: 2021-03-18 | Discharge: 2021-03-19 | Disposition: A | Discharge status: Home / Self Care | Payer: Self-pay | Attending: Emergency Medicine | Admitting: Emergency Medicine

## 2021-03-18 ENCOUNTER — Emergency Department (HOSPITAL_COMMUNITY): Payer: Self-pay

## 2021-03-18 DIAGNOSIS — F17210 Nicotine dependence, cigarettes, uncomplicated: Secondary | ICD-10-CM | POA: Insufficient documentation

## 2021-03-18 DIAGNOSIS — K529 Noninfective gastroenteritis and colitis, unspecified: Secondary | ICD-10-CM | POA: Insufficient documentation

## 2021-03-18 LAB — URINALYSIS, ROUTINE W REFLEX MICROSCOPIC
Bilirubin Urine: NEGATIVE
Glucose, UA: NEGATIVE mg/dL
Hgb urine dipstick: NEGATIVE
Ketones, ur: NEGATIVE mg/dL
Leukocytes,Ua: NEGATIVE
Nitrite: NEGATIVE
Protein, ur: NEGATIVE mg/dL
Specific Gravity, Urine: 1.027 (ref 1.005–1.030)
pH: 5 (ref 5.0–8.0)

## 2021-03-18 LAB — CBC
HCT: 41.9 % (ref 39.0–52.0)
Hemoglobin: 13.8 g/dL (ref 13.0–17.0)
MCH: 31.9 pg (ref 26.0–34.0)
MCHC: 32.9 g/dL (ref 30.0–36.0)
MCV: 96.8 fL (ref 80.0–100.0)
Platelets: 406 10*3/uL — ABNORMAL HIGH (ref 150–400)
RBC: 4.33 MIL/uL (ref 4.22–5.81)
RDW: 12.1 % (ref 11.5–15.5)
WBC: 11.1 10*3/uL — ABNORMAL HIGH (ref 4.0–10.5)
nRBC: 0 % (ref 0.0–0.2)

## 2021-03-18 LAB — COMPREHENSIVE METABOLIC PANEL
ALT: 16 U/L (ref 0–44)
AST: 16 U/L (ref 15–41)
Albumin: 4.2 g/dL (ref 3.5–5.0)
Alkaline Phosphatase: 74 U/L (ref 38–126)
Anion gap: 11 (ref 5–15)
BUN: 7 mg/dL (ref 6–20)
CO2: 29 mmol/L (ref 22–32)
Calcium: 9.3 mg/dL (ref 8.9–10.3)
Chloride: 99 mmol/L (ref 98–111)
Creatinine, Ser: 0.93 mg/dL (ref 0.61–1.24)
GFR, Estimated: >60 mL/min (ref >60)
Glucose, Bld: 92 mg/dL (ref 70–99)
Potassium: 3.7 mmol/L (ref 3.5–5.1)
Sodium: 139 mmol/L (ref 135–145)
Total Bilirubin: 0.5 mg/dL (ref 0.3–1.2)
Total Protein: 7.9 g/dL (ref 6.5–8.1)

## 2021-03-18 LAB — LIPASE, BLOOD: Lipase: 22 U/L (ref 11–51)

## 2021-03-18 MED ORDER — ONDANSETRON HCL 4 MG/2ML IJ SOLN
4.0000 mg | Freq: Once | INTRAMUSCULAR | Status: AC
  Administered 2021-03-18: 4 mg via INTRAVENOUS
  Filled 2021-03-18: qty 2

## 2021-03-18 MED ORDER — FENTANYL CITRATE (PF) 100 MCG/2ML IJ SOLN
100.0000 ug | Freq: Once | INTRAMUSCULAR | Status: AC
  Administered 2021-03-18: 100 ug via INTRAVENOUS
  Filled 2021-03-18: qty 2

## 2021-03-18 MED ORDER — SODIUM CHLORIDE 0.9 % IV BOLUS (SEPSIS)
1000.0000 mL | Freq: Once | INTRAVENOUS | Status: AC
  Administered 2021-03-18: 1000 mL via INTRAVENOUS

## 2021-03-18 MED ORDER — SODIUM CHLORIDE (PF) 0.9 % IJ SOLN
INTRAMUSCULAR | Status: AC
  Administered 2021-03-18: 10 mL
  Filled 2021-03-18: qty 50

## 2021-03-18 MED ORDER — IOHEXOL 300 MG/ML  SOLN
100.0000 mL | Freq: Once | INTRAMUSCULAR | Status: AC | PRN
  Administered 2021-03-18: 100 mL via INTRAVENOUS

## 2021-03-18 NOTE — ED Notes (Signed)
Patient transported to CT 

## 2021-03-18 NOTE — ED Triage Notes (Signed)
Pt came in with LLQ and RLQ abdominal pain that started yesterday. He states it changes in intensity and gets really sharp at times. Pt endorses n/v yesterday. Pt drinks alcohol regularly, but he has not drank today.

## 2021-03-19 MED ORDER — AMOXICILLIN-POT CLAVULANATE 875-125 MG PO TABS
1.0000 | ORAL_TABLET | Freq: Once | ORAL | Status: AC
  Administered 2021-03-19: 1 via ORAL
  Filled 2021-03-19: qty 1

## 2021-03-19 MED ORDER — HYDROCODONE-ACETAMINOPHEN 5-325 MG PO TABS: 1.0000 | ORAL_TABLET | Freq: Four times a day (QID) | ORAL | 0 refills | Status: DC | PRN

## 2021-03-19 MED ORDER — AMOXICILLIN-POT CLAVULANATE 875-125 MG PO TABS: 1.0000 | ORAL_TABLET | Freq: Two times a day (BID) | ORAL | 0 refills | Status: DC

## 2021-03-19 NOTE — ED Provider Notes (Signed)
[facility]-EMERGENCY DEPT Provider Note   CSN: [id number] Arrival date & time: 03/18/21  2017     History Chief Complaint  Patient presents with   Abdominal Pain    Sergio Edwards is a 40 y.o. male.  The history is provided by the patient.  Abdominal Pain Pain location:  LLQ and RLQ Pain quality: aching and cramping   Pain radiates to:  Does not radiate Pain severity:  Moderate Onset quality:  Gradual Duration:  1 day Timing:  Intermittent Progression:  Worsening Chronicity:  Recurrent Relieved by:  Nothing Worsened by:  Nothing Associated symptoms: nausea and vomiting   Associated symptoms: no chest pain, no constipation, no diarrhea, no dysuria, no fever, no hematemesis, no hematochezia and no melena   Risk factors: alcohol abuse   Risk factors: has not had multiple surgeries   Patient reports abdominal pain began over 24 hours ago.  He reports he has had this previously, but this is the worst that has been.  He reports associated nausea vomiting No change in bowel movements. No previous abdominal surgeries.  He admits to frequent alcohol use    PMH-none Soc hx - drinks ETOH 3-4x/week Social History   Tobacco Use   Smoking status: Every Day    Packs/day: 0.50    Pack years: 0.00    Types: Cigarettes   Smokeless tobacco: Never  Substance Use Topics   Alcohol use: Yes    Alcohol/week: 2.0 standard drinks    Types: 2 Cans of beer per week    Comment: last alcohol 4pm 07-20-17    Drug use: Yes    Types: Cocaine    Comment: last used cocaine 07-18-17    Home Medications Prior to Admission medications   Medication Sig Start Date End Date Taking? Authorizing Provider  cephALEXin (KEFLEX) 500 MG capsule Take 1 capsule (500 mg total) by mouth 3 (three) times daily. 01/30/19   Renne Crigler, PA-C  ibuprofen (ADVIL,MOTRIN) 800 MG tablet Take 1 tablet (800 mg total) by mouth 3 (three) times daily. 03/05/18   Wieters, Hallie C, PA-C     Allergies    Patient has no known allergies.  Review of Systems   Review of Systems  Constitutional:  Negative for fever.  Cardiovascular:  Negative for chest pain.  Gastrointestinal:  Positive for abdominal pain, nausea and vomiting. Negative for constipation, diarrhea, hematemesis, hematochezia and melena.  Genitourinary:  Negative for dysuria and testicular pain.  All other systems reviewed and are negative.  Physical Exam Updated Vital Signs BP 117/84 (BP Location: Right Arm)   Pulse 74   Temp 98.1 F (36.7 C) (Oral)   Resp 15   Ht 1.727 m (5\' 8" )   Wt 60.8 kg   SpO2 97%   BMI 20.37 kg/m   Physical Exam CONSTITUTIONAL: Well developed/well nourished HEAD: Normocephalic/atraumatic EYES: EOMI/PERRL, no icterus ENMT: Mucous membranes moist NECK: supple no meningeal signs SPINE/BACK:entire spine nontender CV: S1/S2 noted, no murmurs/rubs/gallops noted LUNGS: Lungs are clear to auscultation bilaterally, no apparent distress ABDOMEN: soft, moderate RLQ and LLQ tenderness, no rebound or guarding, bowel sounds noted throughout abdomen GU:no cva tenderness NEURO: Pt is awake/alert/appropriate, moves all extremitiesx4.  No facial droop.   EXTREMITIES: pulses normal/equal, full ROM SKIN: warm, color normal PSYCH: no abnormalities of mood noted, alert and oriented to situation  ED Results / Procedures / Treatments   Labs (all labs ordered are listed, but only abnormal results are displayed) Labs Reviewed  CBC - Abnormal;  Notable for the following components:      Result Value   WBC 11.1 (*)    Platelets 406 (*)    All other components within normal limits  URINALYSIS, ROUTINE W REFLEX MICROSCOPIC - Abnormal; Notable for the following components:   APPearance HAZY (*)    All other components within normal limits  LIPASE, BLOOD  COMPREHENSIVE METABOLIC PANEL    EKG None  Radiology CT ABDOMEN PELVIS W CONTRAST  Result Date: 03/19/2021 CLINICAL DATA:  Acute left  lower quadrant and right lower quadrant abdominal pain starting yesterday. Nausea and vomiting. EXAM: CT ABDOMEN AND PELVIS WITH CONTRAST TECHNIQUE: Multidetector CT imaging of the abdomen and pelvis was performed using the standard protocol following bolus administration of intravenous contrast. CONTRAST:  OMNIPAQUE IOHEXOL 300 MG/ML  SOLN COMPARISON:  10/13/2014 FINDINGS: Lower chest: Lung bases are clear. Hepatobiliary: No focal liver abnormality is seen. No gallstones, gallbladder wall thickening, or biliary dilatation. Pancreas: Unremarkable. No pancreatic ductal dilatation or surrounding inflammatory changes. Spleen: Normal in size without focal abnormality. Adrenals/Urinary Tract: Adrenal glands are unremarkable. Kidneys are normal, without renal calculi, focal lesion, or hydronephrosis. Bladder is unremarkable. Stomach/Bowel: The stomach, small bowel, and colon are not abnormally distended. There is diffuse wall thickening involving the sigmoid and descending colon with pericolonic fat stranding. This most likely represents colitis and could be of infectious or inflammatory etiologies. There are scattered diverticula but the length of involvement argues against diverticulitis. There is suggestion of mild wall thickening in the terminal ileum suggesting also enteritis. Could also consider Crohn disease in the appropriate clinical setting. Wall thickening and pericolonic changes are most prominent focally in the mid descending region. Suggest follow-up after resolution of acute process to exclude underlying colon lesion. The appendix is normal. Vascular/Lymphatic: No significant vascular findings are present. No enlarged abdominal or pelvic lymph nodes. Reproductive: Prostate is unremarkable. Other: No free air or free fluid in the abdomen. Abdominal wall musculature appears intact. Musculoskeletal: No acute or significant osseous findings. IMPRESSION: 1. Wall thickening of the small bowel and  descending/sigmoid colon with pericolonic stranding in the descending region. Changes most likely to represent enteritis and colitis. Follow-up after resolution of acute process is recommended to exclude underlying colon lesions. Electronically Signed   By: Burman Nieves M.D.   On: 03/19/2021 00:06    Procedures Procedures   Medications Ordered in ED Medications  amoxicillin-clavulanate (AUGMENTIN) 875-125 MG per tablet 1 tablet (has no administration in time range)  fentaNYL (SUBLIMAZE) injection 100 mcg (100 mcg Intravenous Given 03/18/21 2355)  ondansetron (ZOFRAN) injection 4 mg (4 mg Intravenous Given 03/18/21 2355)  sodium chloride 0.9 % bolus 1,000 mL (1,000 mLs Intravenous New Bag/Given 03/18/21 2355)  iohexol (OMNIPAQUE) 300 MG/ML solution 100 mL (100 mLs Intravenous Contrast Given 03/18/21 2343)  sodium chloride (PF) 0.9 % injection (10 mLs  Given by Other 03/18/21 2343)    ED Course  I have reviewed the triage vital signs and the nursing notes.  Pertinent labs & imaging results that were available during my care of the patient were reviewed by me and considered in my medical decision making (see chart for details).    MDM Rules/Calculators/A&P                          This patient presents to the ED for concern of abdominal pain, this involves an extensive number of treatment options, and is a complaint that carries with it a high  risk of complications and morbidity.  The differential diagnosis includes bowel obstruction, bowel perforation, appendicitis, diverticulitis, colitis, kidney stone   Lab Tests:  I Ordered, reviewed, and interpreted labs, which included urinalysis, electrolytes, complete blood count  Medicines ordered:  I ordered medication fentanyl for pain  Imaging Studies ordered:  I ordered imaging studies which included CT abdomen pelvis  I independently visualized and interpreted imaging which showed colitis  Additional history obtained:   Previous  records obtained and reviewed patient was found to have diverticulitis previously   Reevaluation:  After the interventions stated above, I reevaluated the patient and found patient improved  Critical Interventions:  12:30 AM Patient found to have colitis.  He is otherwise well-appearing. He is appropriate for outpatient management. He has had diverticulitis in the past.  Patient will need outpatient gastroenterology follow-up for colonoscopy to evaluate for inflammatory bowel disease or potential cancer 2:07 AM Will give short course of pain medicines.  We will also give Augmentin. Stressed again need for close follow-up with gastroenterology for likely colonoscopy to evaluate for cancer Also advised to cut back on alcohol use Final Clinical Impression(s) / ED Diagnoses Final diagnoses:  Colitis    Rx / DC Orders ED Discharge Orders          Ordered    amoxicillin-clavulanate (AUGMENTIN) 875-125 MG tablet  2 times daily        03/19/21 0146    HYDROcodone-acetaminophen (NORCO/VICODIN) 5-325 MG tablet  Every 6 hours PRN        03/19/21 0146             Zadie Rhine, MD 03/19/21 [phone]

## 2021-04-18 ENCOUNTER — Other Ambulatory Visit: Payer: Self-pay

## 2021-04-18 ENCOUNTER — Ambulatory Visit
Admission: EM | Admit: 2021-04-18 | Discharge: 2021-04-18 | Disposition: A | Discharge status: Home / Self Care | Payer: Self-pay | Attending: Urgent Care | Admitting: Urgent Care

## 2021-04-18 DIAGNOSIS — U071 COVID-19: Secondary | ICD-10-CM

## 2021-04-18 MED ORDER — BENZONATATE 100 MG PO CAPS: 100.0000 mg | ORAL_CAPSULE | Freq: Three times a day (TID) | ORAL | 0 refills | Status: DC | PRN

## 2021-04-18 MED ORDER — PSEUDOEPHEDRINE HCL 60 MG PO TABS: 60.0000 mg | ORAL_TABLET | Freq: Three times a day (TID) | ORAL | 0 refills | Status: DC | PRN

## 2021-04-18 MED ORDER — PROMETHAZINE-DM 6.25-15 MG/5ML PO SYRP: 5.0000 mL | ORAL_SOLUTION | Freq: Every evening | ORAL | 0 refills | Status: DC | PRN

## 2021-04-18 MED ORDER — CETIRIZINE HCL 10 MG PO TABS: 10.0000 mg | ORAL_TABLET | Freq: Every day | ORAL | 0 refills | Status: DC

## 2021-04-18 NOTE — ED Triage Notes (Signed)
Four days ago, Pt's kid tested positive with at home test. Pt notes that he had two days of subjective fever that resolved spontaneously. Confirms abdominal pain. Denies n/v/d/r. No cough, congestion or sore throat. Pt reports that he needs to be covid tested in order to return to work.

## 2021-04-18 NOTE — ED Provider Notes (Signed)
  [facility]   MRN: [id number] DOB: 01-25-1981  Subjective:   Sergio Edwards is a 40 y.o. male presenting for COVID-19 testing.  Patient had COVID exposure last week.  In the past today started to have symptoms and had 2 kids tested positive for COVID-19 at home.  His employer is requiring that he get tested through our facility.  Patient reports that overall he feels okay.  Initially he felt a little fatigue and malaise, some belly pain and had a slight cough but has improved.  Currently denies fever, sinus pain, sore throat, cough, chest pain, shortness of breath, wheezing, nausea, vomiting, abdominal pain, rashes.  No body aches or chills.  Patient smokes 1/4 pack/day.  Denies history of asthma, COPD.  He is not currently taking any medications and has no known food or drug allergies.  Denies past medical and surgical history.   History reviewed. No pertinent family history.  Social History   Tobacco Use   Smoking status: Every Day    Packs/day: 0.50    Types: Cigarettes   Smokeless tobacco: Never  Substance Use Topics   Alcohol use: Yes    Alcohol/week: 2.0 standard drinks    Types: 2 Cans of beer per week    Comment: last alcohol 4pm 07-20-17    Drug use: Yes    Types: Cocaine    Comment: last used cocaine 07-18-17    ROS   Objective:   Vitals: BP 119/83 (BP Location: Left Arm)   Pulse 94   Temp 98.1 F (36.7 C) (Oral)   Resp 18   SpO2 94%   Physical Exam Constitutional:      General: He is not in acute distress.    Appearance: Normal appearance. He is well-developed. He is not ill-appearing, toxic-appearing or diaphoretic.  HENT:     Head: Normocephalic and atraumatic.     Right Ear: External ear normal.     Left Ear: External ear normal.     Nose: Nose normal.     Mouth/Throat:     Mouth: Mucous membranes are moist.     Pharynx: Oropharynx is clear.  Eyes:     General: No scleral icterus.    Extraocular Movements: Extraocular movements  intact.     Pupils: Pupils are equal, round, and reactive to light.  Cardiovascular:     Rate and Rhythm: Normal rate and regular rhythm.     Heart sounds: Normal heart sounds. No murmur heard.   No friction rub. No gallop.  Pulmonary:     Effort: Pulmonary effort is normal. No respiratory distress.     Breath sounds: Normal breath sounds. No stridor. No wheezing, rhonchi or rales.  Neurological:     Mental Status: He is alert and oriented to person, place, and time.  Psychiatric:        Mood and Affect: Mood normal.        Behavior: Behavior normal.        Thought Content: Thought content normal.    Assessment and Plan :   PDMP not reviewed this encounter.  1. Clinical diagnosis of COVID-19     Patient has minimal risk factors and therefore we both agreed to defer COVID antiviral medications.  Use supportive care.  COVID-19 testing pending for confirmation for his employer. Counseled patient on potential for adverse effects with medications prescribed/recommended today, ER and return-to-clinic precautions discussed, patient verbalized understanding.    Wallis Bamberg, [place] 04/18/21 1251

## 2021-04-19 LAB — NOVEL CORONAVIRUS, NAA: SARS-CoV-2, NAA: NOT DETECTED

## 2021-04-19 LAB — SARS-COV-2, NAA 2 DAY TAT

## 2021-10-23 ENCOUNTER — Ambulatory Visit
Admission: EM | Admit: 2021-10-23 | Discharge: 2021-10-23 | Disposition: A | Discharge status: Home / Self Care | Payer: Self-pay | Attending: Internal Medicine | Admitting: Internal Medicine

## 2021-10-23 ENCOUNTER — Encounter: Payer: Self-pay | Admitting: Emergency Medicine

## 2021-10-23 ENCOUNTER — Other Ambulatory Visit: Payer: Self-pay

## 2021-10-23 DIAGNOSIS — S61012A Laceration without foreign body of left thumb without damage to nail, initial encounter: Secondary | ICD-10-CM

## 2021-10-23 NOTE — Discharge Instructions (Signed)
Please monitor for signs of infection that include increased redness, swelling, purulent drainage.  Follow-up if this occurs.

## 2021-10-23 NOTE — ED Provider Notes (Signed)
[facility]    CSN: [id number] Arrival date & time: 10/23/21  1648      History   Chief Complaint Chief Complaint  Patient presents with   Laceration    HPI Sergio Edwards is a 41 y.o. male.   Patient presents with laceration to left thumb that occurred approximately 12 PM today.  Patient reports that he was using a box cutter at work when it slipped and cut his thumb.  Last tetanus vaccine was 2 years ago.  Denies numbness or tingling to the area.  Patient does not take blood thinners.   Laceration  History reviewed. No pertinent past medical history.  There are no problems to display for this patient.   History reviewed. No pertinent surgical history.     Home Medications    Prior to Admission medications   Medication Sig Start Date End Date Taking? Authorizing Provider  amoxicillin-clavulanate (AUGMENTIN) 875-125 MG tablet Take 1 tablet by mouth 2 (two) times daily. One po bid x 7 days 03/19/21   Ripley Fraise, MD  benzonatate (TESSALON) 100 MG capsule Take 1-2 capsules (100-200 mg total) by mouth 3 (three) times daily as needed for cough. 04/18/21   Jaynee Eagles, PA-C  cetirizine (ZYRTEC ALLERGY) 10 MG tablet Take 1 tablet (10 mg total) by mouth daily. 04/18/21   Jaynee Eagles, PA-C  HYDROcodone-acetaminophen (NORCO/VICODIN) 5-325 MG tablet Take 1 tablet by mouth every 6 (six) hours as needed for severe pain. 03/19/21   Ripley Fraise, MD  promethazine-dextromethorphan (PROMETHAZINE-DM) 6.25-15 MG/5ML syrup Take 5 mLs by mouth at bedtime as needed for cough. 04/18/21   Jaynee Eagles, PA-C  pseudoephedrine (SUDAFED) 60 MG tablet Take 1 tablet (60 mg total) by mouth every 8 (eight) hours as needed for congestion. 04/18/21   Jaynee Eagles, PA-C    Family History Family History  Problem Relation Age of Onset   Healthy Mother    Healthy Father     Social History Social History   Tobacco Use   Smoking status: Every Day    Packs/day: 0.50    Types: Cigarettes    Smokeless tobacco: Never  Substance Use Topics   Alcohol use: Yes    Alcohol/week: 2.0 standard drinks    Types: 2 Cans of beer per week    Comment: last alcohol 4pm 07-20-17    Drug use: Yes    Types: Cocaine    Comment: last used cocaine 07-18-17     Allergies   Patient has no known allergies.   Review of Systems Review of Systems Per HPI  Physical Exam Triage Vital Signs ED Triage Vitals  Enc Vitals Group     BP 10/23/21 1843 122/83     Pulse Rate 10/23/21 1843 63     Resp 10/23/21 1843 18     Temp 10/23/21 1843 98.9 F (37.2 C)     Temp Source 10/23/21 1843 Oral     SpO2 10/23/21 1843 96 %     Weight 10/23/21 1846 133 lb (60.3 kg)     Height 10/23/21 1846 5\' 7"  (1.702 m)     Head Circumference --      Peak Flow --      Pain Score 10/23/21 1846 2     Pain Loc --      Pain Edu? --      Excl. in [place]? --    No data found.  Updated Vital Signs BP 122/83 (BP Location: Left Arm)    Pulse 63  Temp 98.9 F (37.2 C) (Oral)    Resp 18    Ht 5\' 7"  (1.702 m)    Wt 133 lb (60.3 kg)    SpO2 96%    BMI 20.83 kg/m   Visual Acuity Right Eye Distance:   Left Eye Distance:   Bilateral Distance:    Right Eye Near:   Left Eye Near:    Bilateral Near:     Physical Exam Constitutional:      General: He is not in acute distress.    Appearance: Normal appearance. He is not toxic-appearing or diaphoretic.  HENT:     Head: Normocephalic and atraumatic.  Eyes:     Extraocular Movements: Extraocular movements intact.     Conjunctiva/sclera: Conjunctivae normal.  Pulmonary:     Effort: Pulmonary effort is normal.  Skin:    General: Skin is warm and dry.     Findings: Laceration present.     Comments: Approximately 4 to 5 mm superficial linear laceration present to distal end of left thumb directly below nailbed.  No serious bleeding.  Neurovascular intact.  Patient has full range of motion of thumb.  Neurological:     General: No focal deficit present.     Mental  Status: He is alert and oriented to person, place, and time. Mental status is at baseline.  Psychiatric:        Mood and Affect: Mood normal.        Behavior: Behavior normal.        Thought Content: Thought content normal.        Judgment: Judgment normal.     UC Treatments / Results  Labs (all labs ordered are listed, but only abnormal results are displayed) Labs Reviewed - No data to display  EKG   Radiology No results found.  Procedures Laceration Repair  Date/Time: 10/23/2021 7:23 PM Performed by: Teodora Medici, FNP Authorized by: Teodora Medici, FNP   Consent:    Consent obtained:  Verbal   Consent given by:  Patient   Risks, benefits, and alternatives were discussed: yes     Risks discussed:  Infection and pain   Alternatives discussed:  No treatment and delayed treatment Universal protocol:    Procedure explained and questions answered to patient or proxy's satisfaction: yes     Site/side marked: yes     Immediately prior to procedure, a time out was called: yes     Patient identity confirmed:  Verbally with patient and arm band Anesthesia:    Anesthesia method:  None Laceration details:    Location:  Finger   Finger location:  L thumb   Length (cm):  0.5 Exploration:    Imaging outcome: foreign body not noted     Wound exploration: wound explored through full range of motion and entire depth of wound visualized     Contaminated: no   Treatment:    Area cleansed with:  Chlorhexidine   Amount of cleaning:  Standard   Debridement:  None Skin repair:    Repair method:  Tissue adhesive (dermabond) Approximation:    Approximation:  Close Repair type:    Repair type:  Simple Post-procedure details:    Dressing:  Non-adherent dressing   Procedure completion:  Tolerated well, no immediate complications (including critical care time)  Medications Ordered in UC Medications - No data to display  Initial Impression / Assessment and Plan / UC Course  I have  reviewed the triage vital signs and the nursing  notes.  Pertinent labs & imaging results that were available during my care of the patient were reviewed by me and considered in my medical decision making (see chart for details).     Laceration closed with Dermabond.  Do not think that sutures are necessary given depth of wound.  Wound edges were closely approximated prior to patient discharge.  No signs of infection.  Patient to monitor for infection.  No need for tetanus as last tetanus was administered 2 years ago for patient.  Discussed return precautions.  Patient verbalized understanding and was agreeable with plan. Final Clinical Impressions(s) / UC Diagnoses   Final diagnoses:  Laceration of left thumb without foreign body without damage to nail, initial encounter     Discharge Instructions      Please monitor for signs of infection that include increased redness, swelling, purulent drainage.  Follow-up if this occurs.    ED Prescriptions   None    PDMP not reviewed this encounter.   Teodora Medici, [place] 10/23/21 1925

## 2021-10-23 NOTE — ED Triage Notes (Signed)
Patient states that he cut his left thumb today around noon with a box cutter at work.  Last Tdap about 2 year ago.

## 2022-05-03 ENCOUNTER — Ambulatory Visit
Admission: EM | Admit: 2022-05-03 | Discharge: 2022-05-03 | Disposition: A | Discharge status: Home / Self Care | Payer: Self-pay | Attending: Internal Medicine | Admitting: Internal Medicine

## 2022-05-03 DIAGNOSIS — R051 Acute cough: Secondary | ICD-10-CM

## 2022-05-03 DIAGNOSIS — J069 Acute upper respiratory infection, unspecified: Secondary | ICD-10-CM

## 2022-05-03 MED ORDER — BENZONATATE 100 MG PO CAPS: 100.0000 mg | ORAL_CAPSULE | Freq: Three times a day (TID) | ORAL | 0 refills | Status: DC | PRN

## 2022-05-03 MED ORDER — FLUTICASONE PROPIONATE 50 MCG/ACT NA SUSP: 1.0000 | Freq: Every day | NASAL | 0 refills | Status: DC

## 2022-05-03 MED ORDER — AZITHROMYCIN 250 MG PO TABS: ORAL_TABLET | ORAL | 0 refills | Status: AC

## 2022-05-03 NOTE — Discharge Instructions (Signed)
You have been prescribed 3 medications to help alleviate your upper respiratory infection.  Please follow-up if symptoms persist or worsen.

## 2022-05-03 NOTE — ED Triage Notes (Signed)
Pt presents to [facility] with co of cough and chest congestion with yellow phlegm since last week. Pt reports musinex otc and allergy medication with minimal improvement

## 2022-05-03 NOTE — ED Provider Notes (Signed)
[facility]    CSN: [id number] Arrival date & time: 05/03/22  2993      History   Chief Complaint Chief Complaint  Patient presents with   Nasal Congestion   chest congestion     HPI ROSWELL NDIAYE is a 41 y.o. male.   Patient presents with nasal congestion, cough, feelings of chest congestion that started 7 to 8 days ago.  Patient reports that he has been taking Mucinex over-the-counter with no improvement of symptoms.  Denies any known sick contacts or fever.  Denies chest pain, shortness of breath, nausea, vomiting, diarrhea, abdominal pain, sore throat, ear pain.  Denies history of asthma or COPD but patient is a smoker.     No past medical history on file.  There are no problems to display for this patient.   No past surgical history on file.     Home Medications    Prior to Admission medications   Medication Sig Start Date End Date Taking? Authorizing Provider  azithromycin (ZITHROMAX Z-PAK) 250 MG tablet Take 2 tablets (500 mg total) by mouth daily for 1 day, THEN 1 tablet (250 mg total) daily for 4 days. 05/03/22 05/08/22 Yes Mechel Haggard, Acie Fredrickson, FNP  benzonatate (TESSALON) 100 MG capsule Take 1 capsule (100 mg total) by mouth every 8 (eight) hours as needed for cough. 05/03/22  Yes Ania Levay, Acie Fredrickson, FNP  fluticasone (FLONASE) 50 MCG/ACT nasal spray Place 1 spray into both nostrils daily for 3 days. 05/03/22 05/06/22 Yes Vandell Kun, Acie Fredrickson, FNP  cetirizine (ZYRTEC ALLERGY) 10 MG tablet Take 1 tablet (10 mg total) by mouth daily. 04/18/21   Wallis Bamberg, PA-C  HYDROcodone-acetaminophen (NORCO/VICODIN) 5-325 MG tablet Take 1 tablet by mouth every 6 (six) hours as needed for severe pain. Patient not taking: Reported on 05/03/2022 03/19/21   Zadie Rhine, MD    Family History Family History  Problem Relation Age of Onset   Healthy Mother    Healthy Father     Social History Social History   Tobacco Use   Smoking status: Every Day    Packs/day: 0.50     Types: Cigarettes   Smokeless tobacco: Never  Substance Use Topics   Alcohol use: Yes    Alcohol/week: 2.0 standard drinks of alcohol    Types: 2 Cans of beer per week    Comment: last alcohol 4pm 07-20-17    Drug use: Yes    Types: Cocaine    Comment: last used cocaine 07-18-17     Allergies   Patient has no known allergies.   Review of Systems Review of Systems Per HPI  Physical Exam Triage Vital Signs ED Triage Vitals  Enc Vitals Group     BP 05/03/22 1040 121/81     Pulse Rate 05/03/22 1040 86     Resp 05/03/22 1040 18     Temp 05/03/22 1040 98 F (36.7 C)     Temp src --      SpO2 05/03/22 1040 98 %     Weight --      Height --      Head Circumference --      Peak Flow --      Pain Score 05/03/22 1038 3     Pain Loc --      Pain Edu? --      Excl. in GC? --    No data found.  Updated Vital Signs BP 121/81   Pulse 86   Temp 98 F (  36.7 C)   Resp 18   SpO2 98%   Visual Acuity Right Eye Distance:   Left Eye Distance:   Bilateral Distance:    Right Eye Near:   Left Eye Near:    Bilateral Near:     Physical Exam Constitutional:      General: He is not in acute distress.    Appearance: Normal appearance. He is not toxic-appearing or diaphoretic.  HENT:     Head: Normocephalic and atraumatic.     Right Ear: Tympanic membrane and ear canal normal.     Left Ear: Tympanic membrane and ear canal normal.     Nose: Congestion present.     Mouth/Throat:     Mouth: Mucous membranes are moist.     Pharynx: No posterior oropharyngeal erythema.  Eyes:     Extraocular Movements: Extraocular movements intact.     Conjunctiva/sclera: Conjunctivae normal.     Pupils: Pupils are equal, round, and reactive to light.  Cardiovascular:     Rate and Rhythm: Normal rate and regular rhythm.     Pulses: Normal pulses.     Heart sounds: Normal heart sounds.  Pulmonary:     Effort: Pulmonary effort is normal. No respiratory distress.     Breath sounds: Normal  breath sounds. No stridor. No wheezing, rhonchi or rales.     Comments: Harsh cough on exam. Abdominal:     General: Abdomen is flat. Bowel sounds are normal.     Palpations: Abdomen is soft.  Musculoskeletal:        General: Normal range of motion.     Cervical back: Normal range of motion.  Skin:    General: Skin is warm and dry.  Neurological:     General: No focal deficit present.     Mental Status: He is alert and oriented to person, place, and time. Mental status is at baseline.  Psychiatric:        Mood and Affect: Mood normal.        Behavior: Behavior normal.      UC Treatments / Results  Labs (all labs ordered are listed, but only abnormal results are displayed) Labs Reviewed - No data to display  EKG   Radiology No results found.  Procedures Procedures (including critical Edwards time)  Medications Ordered in UC Medications - No data to display  Initial Impression / Assessment and Plan / UC Course  I have reviewed the triage vital signs and the nursing notes.  Pertinent labs & imaging results that were available during my Edwards of the patient were reviewed by me and considered in my medical decision making (see chart for details).     Patient;s symptoms are most likely viral, although given duration of symptoms and symptoms being refractory to over-the-counter medications will opt to treat with azithromycin to cover for atypicals.  Do not think chest imaging is necessary given no adventitious lung sounds or signs of respiratory compromise on exam.  Supportive Edwards and symptom management prescriptions also sent for patient.  Patient to follow-up if symptoms persist or worsen.  Do not think viral testing for COVID is necessary given duration of symptoms.  Discussed return precautions.  Patient verbalized understanding and was agreeable with plan. Final Clinical Impressions(s) / UC Diagnoses   Final diagnoses:  Acute upper respiratory infection  Acute cough      Discharge Instructions      You have been prescribed 3 medications to help alleviate your upper respiratory infection.  Please follow-up if symptoms persist or worsen.    ED Prescriptions     Medication Sig Dispense Auth. Provider   azithromycin (ZITHROMAX Z-PAK) 250 MG tablet Take 2 tablets (500 mg total) by mouth daily for 1 day, THEN 1 tablet (250 mg total) daily for 4 days. 6 tablet [place], [place], [place]   fluticasone [facility]) 50 MCG/ACT nasal spray Place 1 spray into both nostrils daily for 3 days. 16 g Ruford Dudzinski, Rolly Salter E, [place]   benzonatate (TESSALON) 100 MG capsule Take 1 capsule (100 mg total) by mouth every 8 (eight) hours as needed for cough. 21 capsule [place], Acie Fredrickson, [place]      PDMP not reviewed this encounter.   Gustavus Bryant, [place] 05/03/22 1130

## 2022-09-21 DIAGNOSIS — K529 Noninfective gastroenteritis and colitis, unspecified: Secondary | ICD-10-CM | POA: Insufficient documentation

## 2023-02-25 IMAGING — CT CT ABD-PELV W/ CM
2 of 4 series · 15 of 46 positions shown, 17 images · IV contrast (OMNIPAQUE 300)
Comparison: 10/13/2014

CLINICAL DATA: Acute left lower quadrant and right lower quadrant
abdominal pain starting yesterday. Nausea and vomiting.

EXAM:
CT ABDOMEN AND PELVIS WITH CONTRAST
TECHNIQUE: Multidetector CT imaging of the abdomen and pelvis was performed
using the standard protocol following bolus administration of
intravenous contrast.
CONTRAST:  100mL OMNIPAQUE IOHEXOL 300 MG/ML  SOLN

[Series 2: axial st · axial · 0.64mm/px · z∈[-496,-126]mm · 12 of 84 slices shown, 14 images]
[im 5/84  soft-tissue]
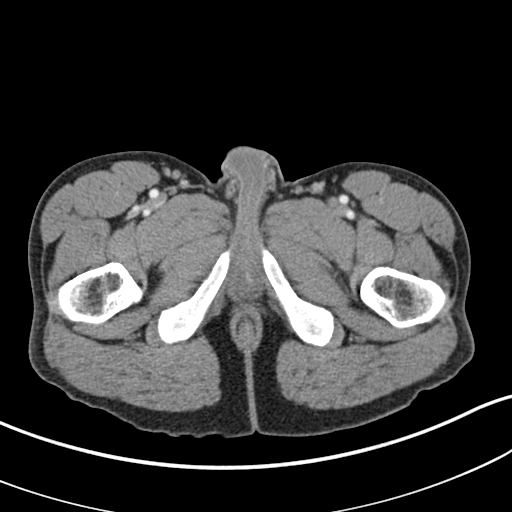
[im 5/84  bone]
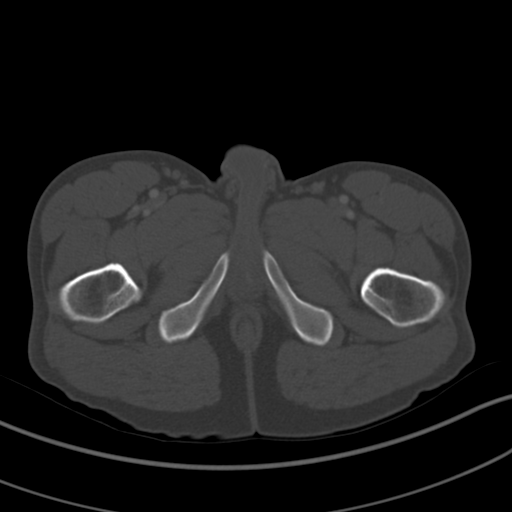
[im 15/84  soft-tissue]
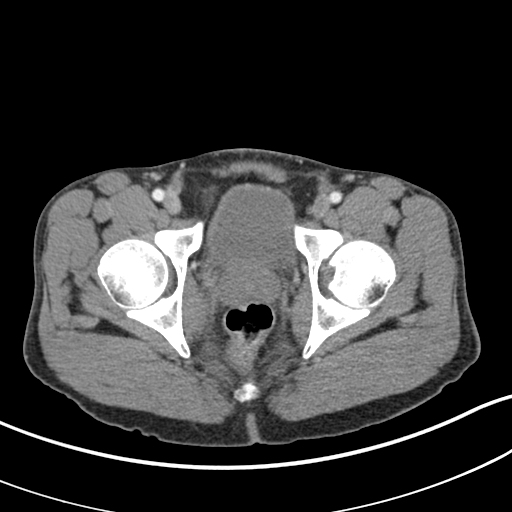
[im 20/84  soft-tissue]
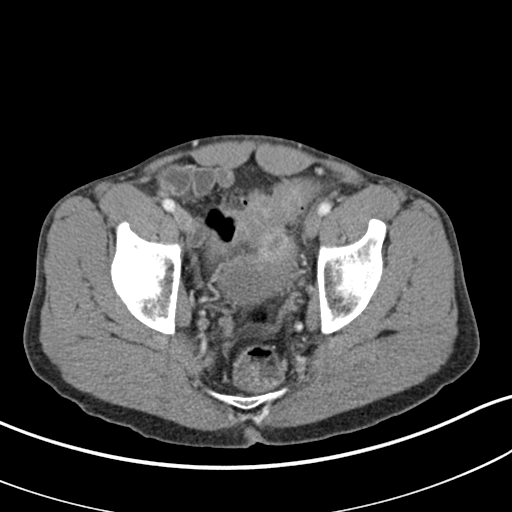
[im 25/84  soft-tissue]
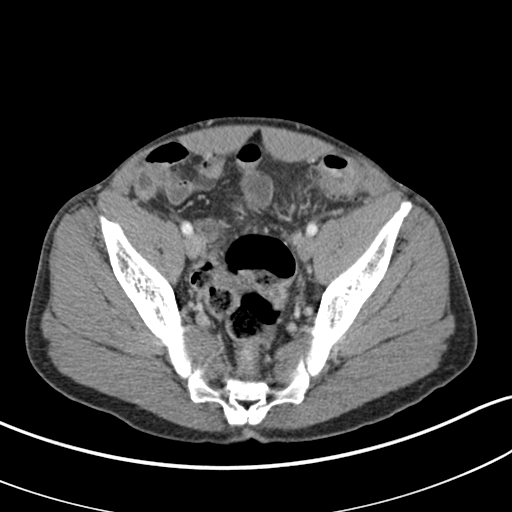
[im 35/84  soft-tissue]
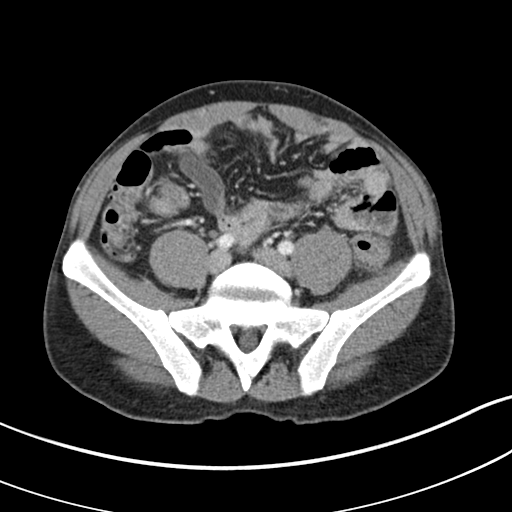
[im 40/84  soft-tissue]
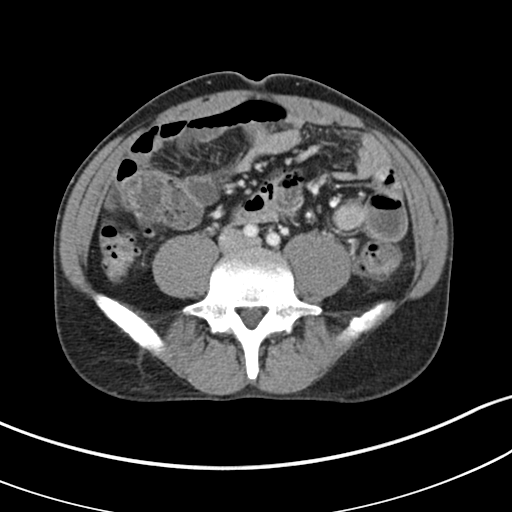
[im 44/84  soft-tissue]
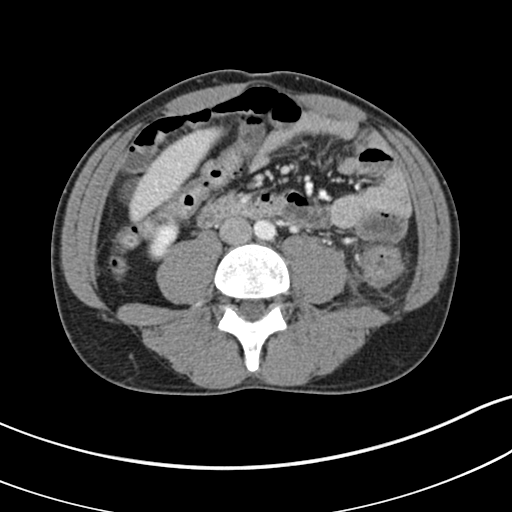
[im 54/84  soft-tissue]
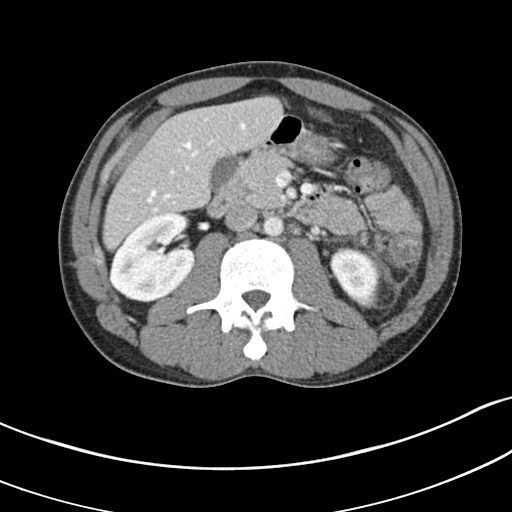
[im 59/84  soft-tissue]
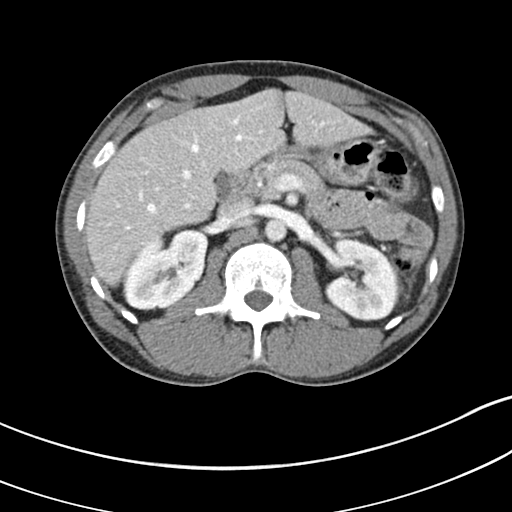
[im 59/84  bone]
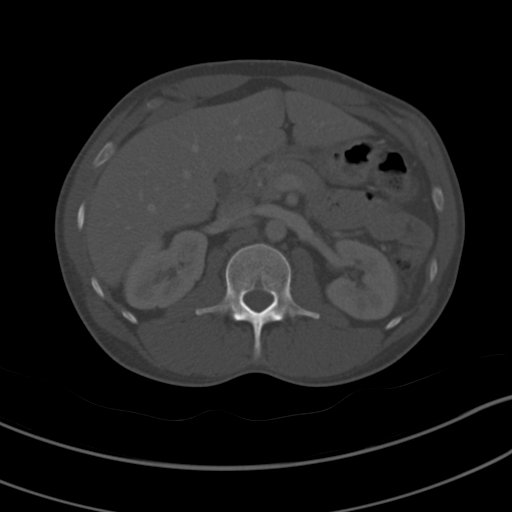
[im 64/84  soft-tissue]
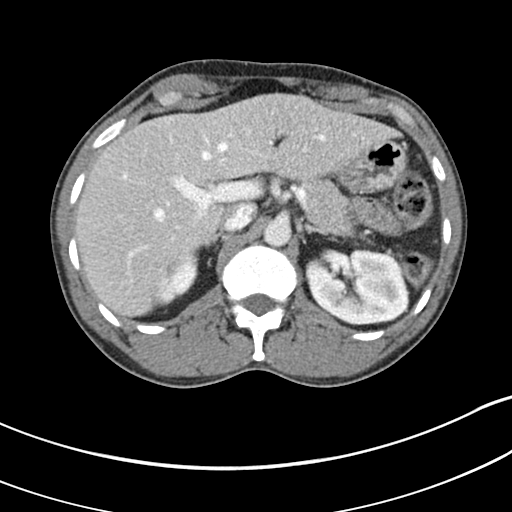
[im 74/84  soft-tissue]
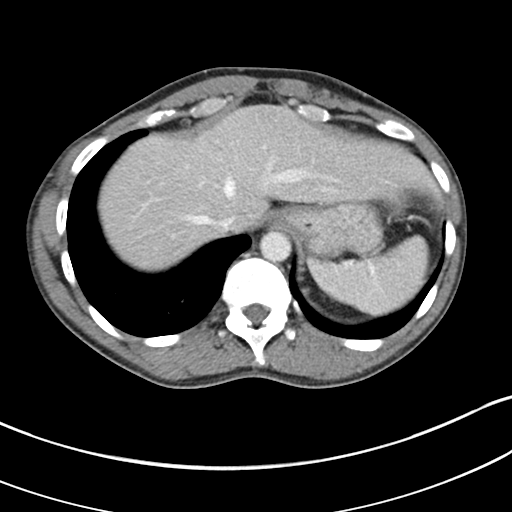
[im 79/84  soft-tissue]
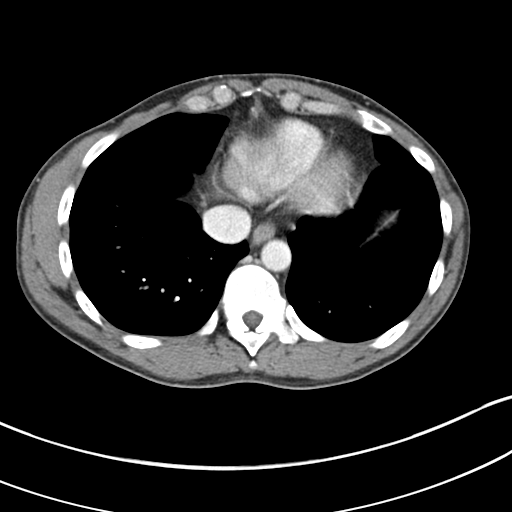

[Series 5: coronal st · coronal · 0.57mm/px · 3 of 119 slices shown]
[im 40/119  soft-tissue]
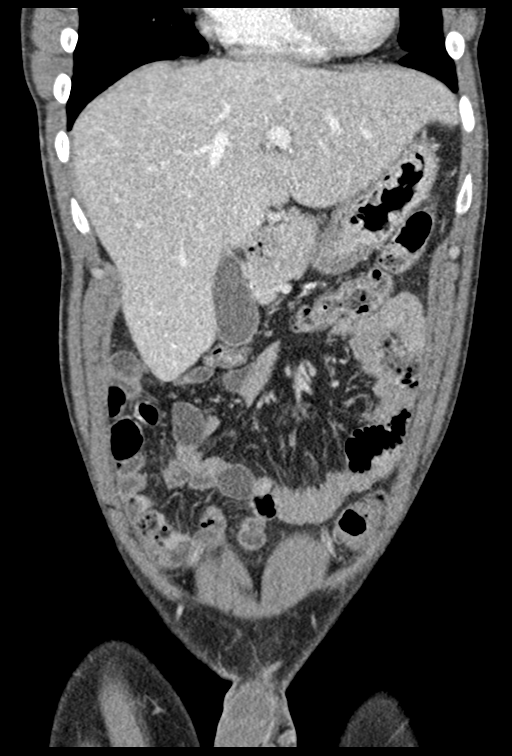
[im 53/119  soft-tissue]
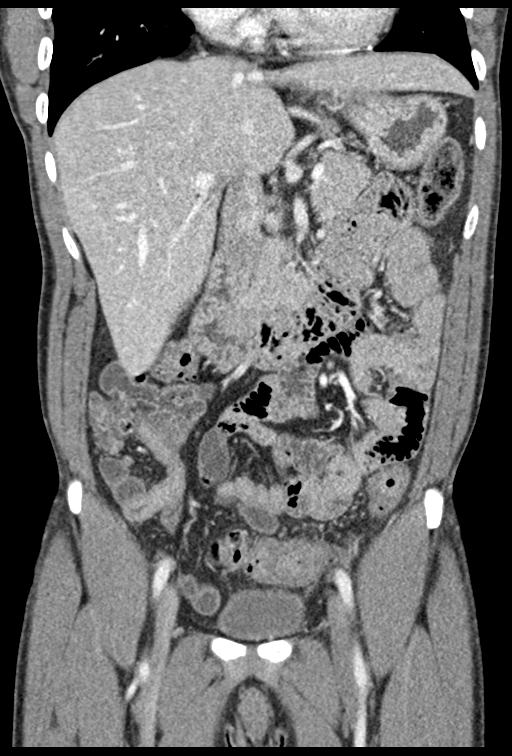
[im 66/119  soft-tissue]
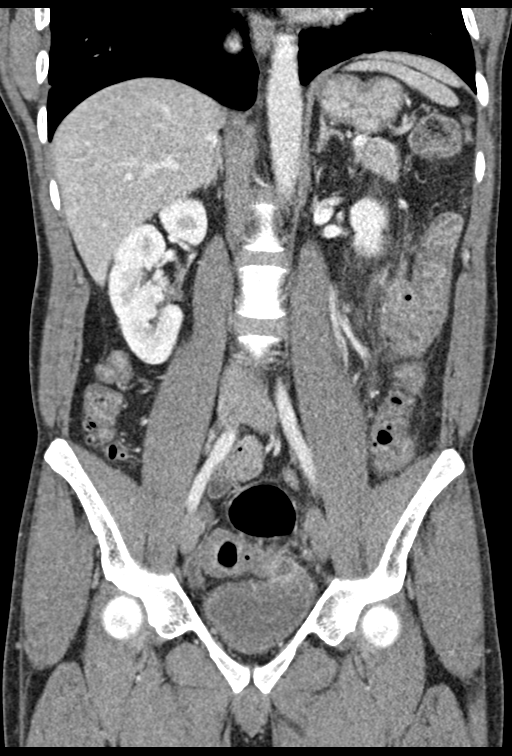

[15 of 46 positions shown; findings below may reference images not displayed]

FINDINGS: Lower chest: Lung bases are clear.

Hepatobiliary: No focal liver abnormality is seen. No gallstones,
gallbladder wall thickening, or biliary dilatation.

Pancreas: Unremarkable. No pancreatic ductal dilatation or
surrounding inflammatory changes.

Spleen: Normal in size without focal abnormality.

Adrenals/Urinary Tract: Adrenal glands are unremarkable. Kidneys are
normal, without renal calculi, focal lesion, or hydronephrosis.
Bladder is unremarkable.

Stomach/Bowel: The stomach, small bowel, and colon are not
abnormally distended. There is diffuse wall thickening involving the
sigmoid and descending colon with pericolonic fat stranding. This
most likely represents colitis and could be of infectious or
inflammatory etiologies. There are scattered diverticula but the
length of involvement argues against diverticulitis. There is
suggestion of mild wall thickening in the terminal ileum suggesting
also enteritis. Could also consider Crohn disease in the appropriate
clinical setting. Wall thickening and pericolonic changes are most
prominent focally in the mid descending region. Suggest follow-up
after resolution of acute process to exclude underlying colon
lesion. The appendix is normal.

Vascular/Lymphatic: No significant vascular findings are present. No
enlarged abdominal or pelvic lymph nodes.

Reproductive: Prostate is unremarkable.

Other: No free air or free fluid in the abdomen. Abdominal wall
musculature appears intact.

Musculoskeletal: No acute or significant osseous findings.
IMPRESSION: 1. Wall thickening of the small bowel and descending/sigmoid colon
with pericolonic stranding in the descending region. Changes most
likely to represent enteritis and colitis. Follow-up after
resolution of acute process is recommended to exclude underlying
colon lesions.

## 2023-09-20 ENCOUNTER — Encounter (HOSPITAL_COMMUNITY): Payer: Self-pay

## 2023-09-20 ENCOUNTER — Ambulatory Visit (HOSPITAL_COMMUNITY)
Admission: EM | Admit: 2023-09-20 | Discharge: 2023-09-20 | Disposition: A | Discharge status: Home / Self Care | Payer: Self-pay | Attending: Emergency Medicine | Admitting: Emergency Medicine

## 2023-09-20 DIAGNOSIS — R300 Dysuria: Secondary | ICD-10-CM | POA: Diagnosis not present

## 2023-09-20 DIAGNOSIS — R1030 Lower abdominal pain, unspecified: Secondary | ICD-10-CM | POA: Diagnosis not present

## 2023-09-20 LAB — COMPREHENSIVE METABOLIC PANEL
ALT: 14 U/L (ref 0–44)
AST: 17 U/L (ref 15–41)
Albumin: 4 g/dL (ref 3.5–5.0)
Alkaline Phosphatase: 80 U/L (ref 38–126)
Anion gap: 12 (ref 5–15)
BUN: 12 mg/dL (ref 6–20)
CO2: 26 mmol/L (ref 22–32)
Calcium: 9.7 mg/dL (ref 8.9–10.3)
Chloride: 97 mmol/L — ABNORMAL LOW (ref 98–111)
Creatinine, Ser: 0.94 mg/dL (ref 0.61–1.24)
GFR, Estimated: >60 mL/min (ref >60)
Glucose, Bld: 167 mg/dL — ABNORMAL HIGH (ref 70–99)
Potassium: 4.4 mmol/L (ref 3.5–5.1)
Sodium: 135 mmol/L (ref 135–145)
Total Bilirubin: 0.8 mg/dL (ref 0.0–1.2)
Total Protein: 8.1 g/dL (ref 6.5–8.1)

## 2023-09-20 LAB — CBC WITH DIFFERENTIAL/PLATELET
Abs Immature Granulocytes: 0.04 10*3/uL (ref 0.00–0.07)
Basophils Absolute: 0.1 10*3/uL (ref 0.0–0.1)
Basophils Relative: 1 %
Eosinophils Absolute: 0.1 10*3/uL (ref 0.0–0.5)
Eosinophils Relative: 1 %
HCT: 41.2 % (ref 39.0–52.0)
Hemoglobin: 13.7 g/dL (ref 13.0–17.0)
Immature Granulocytes: 0 %
Lymphocytes Relative: 20 %
Lymphs Abs: 2.2 10*3/uL (ref 0.7–4.0)
MCH: 31.7 pg (ref 26.0–34.0)
MCHC: 33.3 g/dL (ref 30.0–36.0)
MCV: 95.4 fL (ref 80.0–100.0)
Monocytes Absolute: 1.2 10*3/uL — ABNORMAL HIGH (ref 0.1–1.0)
Monocytes Relative: 11 %
Neutro Abs: 7.7 10*3/uL (ref 1.7–7.7)
Neutrophils Relative %: 67 %
Platelets: 368 10*3/uL (ref 150–400)
RBC: 4.32 MIL/uL (ref 4.22–5.81)
RDW: 12.5 % (ref 11.5–15.5)
WBC: 11.3 10*3/uL — ABNORMAL HIGH (ref 4.0–10.5)
nRBC: 0 % (ref 0.0–0.2)

## 2023-09-20 LAB — POCT URINALYSIS DIP (MANUAL ENTRY)
Blood, UA: NEGATIVE
Glucose, UA: NEGATIVE mg/dL
Leukocytes, UA: NEGATIVE
Nitrite, UA: NEGATIVE
Spec Grav, UA: >=1.03 — AB (ref 1.010–1.025)
Urobilinogen, UA: 0.2 U/dL
pH, UA: 5 (ref 5.0–8.0)

## 2023-09-20 LAB — LIPASE, BLOOD: Lipase: 25 U/L (ref 11–51)

## 2023-09-20 MED ORDER — ACETAMINOPHEN 325 MG PO TABS
650.0000 mg | ORAL_TABLET | Freq: Once | ORAL | Status: AC
  Administered 2023-09-20: 650 mg via ORAL

## 2023-09-20 MED ORDER — ACETAMINOPHEN 325 MG PO TABS
ORAL_TABLET | ORAL | Status: AC
  Filled 2023-09-20: qty 2

## 2023-09-20 NOTE — Discharge Instructions (Signed)
 Your urine does not show any signs of infection.  It does show that you are significantly dehydrated, ensure you are drinking at least 64 ounces of water daily.  We have done some basic lab work, contact you if anything is urgent or emergent.  If your abdominal pain worsens or persists please follow-up with the nearest emergency department for advanced imaging and further evaluation.

## 2023-09-20 NOTE — ED Provider Notes (Signed)
 [facility]    CSN: [id number] Arrival date & time: 09/20/23  1512      History   Chief Complaint Chief Complaint  Patient presents with   Abdominal Pain    HPI OTHO MICHALIK is a 43 y.o. male.   Patient presents to clinic with lower abdominal discomfort and pain with urination for the past three days. He has not had any penile discharge.  Reports he is married with a long-term wife and is not concerned for sexually transmitted infections.  Denies any scrotal swelling or scrotal pain.  Not had any nausea or vomiting.  Has had some bilateral lower back pain. No urinary odor.   Endorses lower abdominal pain.  He does drink alcohol daily but has not been drinking as much due to his abdominal pain.  Reports having a beer last night.  He has some tea with him in clinic, has not had any water today.  The history is provided by the patient and medical records.  Abdominal Pain   History reviewed. No pertinent past medical history.  There are no active problems to display for this patient.   History reviewed. No pertinent surgical history.     Home Medications    Prior to Admission medications   Not on File    Family History Family History  Problem Relation Age of Onset   Healthy Mother    Healthy Father     Social History Social History   Tobacco Use   Smoking status: Every Day    Current packs/day: 0.50    Types: Cigarettes   Smokeless tobacco: Never  Vaping Use   Vaping status: Never Used  Substance Use Topics   Alcohol use: Yes    Alcohol/week: 2.0 standard drinks of alcohol    Types: 2 Cans of beer per week    Comment: last alcohol 4pm 07-20-17    Drug use: Yes    Types: Cocaine    Comment: 09/17/2023     Allergies   Patient has no known allergies.   Review of Systems Review of Systems  Per HPI   Physical Exam Triage Vital Signs ED Triage Vitals  Encounter Vitals Group     BP 09/20/23 1634 (!) 152/97     Systolic BP  Percentile --      Diastolic BP Percentile --      Pulse Rate 09/20/23 1634 69     Resp 09/20/23 1634 16     Temp 09/20/23 1634 97.8 F (36.6 C)     Temp Source 09/20/23 1634 Oral     SpO2 09/20/23 1634 98 %     Weight 09/20/23 1633 127 lb (57.6 kg)     Height 09/20/23 1633 5' 7 (1.702 m)     Head Circumference --      Peak Flow --      Pain Score 09/20/23 1633 9     Pain Loc --      Pain Education --      Exclude from Growth Chart --    No data found.  Updated Vital Signs BP (!) 152/97 (BP Location: Left Arm)   Pulse 69   Temp 97.8 F (36.6 C) (Oral)   Resp 16   Ht 5' 7 (1.702 m)   Wt 127 lb (57.6 kg)   SpO2 98%   BMI 19.89 kg/m   Visual Acuity Right Eye Distance:   Left Eye Distance:   Bilateral Distance:    Right Eye Near:  Left Eye Near:    Bilateral Near:     Physical Exam Vitals and nursing note reviewed.  Constitutional:      Appearance: He is well-developed.  HENT:     Head: Normocephalic and atraumatic.  Cardiovascular:     Rate and Rhythm: Normal rate.  Pulmonary:     Effort: Pulmonary effort is normal. No respiratory distress.  Abdominal:     General: Abdomen is flat. Bowel sounds are normal.     Palpations: Abdomen is soft.     Tenderness: There is abdominal tenderness in the suprapubic area. There is no guarding or rebound.  Skin:    General: Skin is warm and dry.  Neurological:     General: No focal deficit present.     Mental Status: He is alert and oriented to person, place, and time.  Psychiatric:        Mood and Affect: Mood normal.        Behavior: Behavior normal.      UC Treatments / Results  Labs (all labs ordered are listed, but only abnormal results are displayed) Labs Reviewed  POCT URINALYSIS DIP (MANUAL ENTRY) - Abnormal; Notable for the following components:      Result Value   Color, UA orange (*)    Bilirubin, UA small (*)    Ketones, POC UA moderate (40) (*)    Spec Grav, UA >=1.030 (*)    Protein Ur, POC  trace (*)    All other components within normal limits  COMPREHENSIVE METABOLIC PANEL  CBC WITH DIFFERENTIAL/PLATELET  LIPASE, BLOOD    EKG   Radiology No results found.  Procedures Procedures (including critical care time)  Medications Ordered in UC Medications  acetaminophen  (TYLENOL ) tablet 650 mg (has no administration in time range)    Initial Impression / Assessment and Plan / UC Course  I have reviewed the triage vital signs and the nursing notes.  Pertinent labs & imaging results that were available during my care of the patient were reviewed by me and considered in my medical decision making (see chart for details).  Vitals and triage reviewed, patient is hemodynamically stable.  Lower abdominal tenderness to palpation with active bowel sounds.  No rebound or guarding or concerns for surgical abdomen at this time.  Urine is orange with bilirubin, ketones, high specific gravity and trace protein.  CBC, CMP and lipase obtained today in clinic.  No obvious infection seen in urine.  Encouraged oral rehydration, limiting alcohol use and strict emergency precautions given if symptoms worsen or evolve in any way.  Patient verbalized understanding, no questions at this time.  Patient requesting pain medication at discharge for abdominal pain.  Advised against ibuprofen  due to chronic alcohol use, will give Tylenol .  Emergency precautions reiterated.     Final Clinical Impressions(s) / UC Diagnoses   Final diagnoses:  Lower abdominal pain  Dysuria     Discharge Instructions      Your urine does not show any signs of infection.  It does show that you are significantly dehydrated, ensure you are drinking at least 64 ounces of water daily.  We have done some basic lab work, contact you if anything is urgent or emergent.  If your abdominal pain worsens or persists please follow-up with the nearest emergency department for advanced imaging and further evaluation.      ED  Prescriptions   None    PDMP not reviewed this encounter.   [place], [place]  N, FNP 09/20/23 1743

## 2023-09-20 NOTE — ED Triage Notes (Signed)
 Patient here today with c/o lower abd pain and pain in urination X 3 days.

## 2023-10-02 ENCOUNTER — Emergency Department (HOSPITAL_COMMUNITY)
Admission: EM | Admit: 2023-10-02 | Discharge: 2023-10-02 | Disposition: A | Discharge status: Home / Self Care | Payer: 59 | Attending: Emergency Medicine | Admitting: Emergency Medicine

## 2023-10-02 ENCOUNTER — Emergency Department (HOSPITAL_COMMUNITY): Payer: 59

## 2023-10-02 ENCOUNTER — Other Ambulatory Visit: Payer: Self-pay

## 2023-10-02 DIAGNOSIS — R109 Unspecified abdominal pain: Secondary | ICD-10-CM | POA: Diagnosis not present

## 2023-10-02 DIAGNOSIS — K5730 Diverticulosis of large intestine without perforation or abscess without bleeding: Secondary | ICD-10-CM | POA: Diagnosis not present

## 2023-10-02 DIAGNOSIS — N390 Urinary tract infection, site not specified: Secondary | ICD-10-CM | POA: Insufficient documentation

## 2023-10-02 DIAGNOSIS — R300 Dysuria: Secondary | ICD-10-CM | POA: Diagnosis not present

## 2023-10-02 LAB — BASIC METABOLIC PANEL
Anion gap: 9 (ref 5–15)
BUN: 13 mg/dL (ref 6–20)
CO2: 28 mmol/L (ref 22–32)
Calcium: 9.5 mg/dL (ref 8.9–10.3)
Chloride: 99 mmol/L (ref 98–111)
Creatinine, Ser: 0.94 mg/dL (ref 0.61–1.24)
GFR, Estimated: >60 mL/min (ref >60)
Glucose, Bld: 93 mg/dL (ref 70–99)
Potassium: 3.4 mmol/L — ABNORMAL LOW (ref 3.5–5.1)
Sodium: 136 mmol/L (ref 135–145)

## 2023-10-02 LAB — CBC WITH DIFFERENTIAL/PLATELET
Abs Immature Granulocytes: 0.08 10*3/uL — ABNORMAL HIGH (ref 0.00–0.07)
Basophils Absolute: 0.1 10*3/uL (ref 0.0–0.1)
Basophils Relative: 0 %
Eosinophils Absolute: 0.1 10*3/uL (ref 0.0–0.5)
Eosinophils Relative: 1 %
HCT: 41.3 % (ref 39.0–52.0)
Hemoglobin: 13.4 g/dL (ref 13.0–17.0)
Immature Granulocytes: 1 %
Lymphocytes Relative: 17 %
Lymphs Abs: 2.1 10*3/uL (ref 0.7–4.0)
MCH: 32.1 pg (ref 26.0–34.0)
MCHC: 32.4 g/dL (ref 30.0–36.0)
MCV: 98.8 fL (ref 80.0–100.0)
Monocytes Absolute: 1 10*3/uL (ref 0.1–1.0)
Monocytes Relative: 8 %
Neutro Abs: 9 10*3/uL — ABNORMAL HIGH (ref 1.7–7.7)
Neutrophils Relative %: 73 %
Platelets: 425 10*3/uL — ABNORMAL HIGH (ref 150–400)
RBC: 4.18 MIL/uL — ABNORMAL LOW (ref 4.22–5.81)
RDW: 12.5 % (ref 11.5–15.5)
WBC: 12.3 10*3/uL — ABNORMAL HIGH (ref 4.0–10.5)
nRBC: 0 % (ref 0.0–0.2)

## 2023-10-02 LAB — URINALYSIS, ROUTINE W REFLEX MICROSCOPIC
Bilirubin Urine: NEGATIVE
Glucose, UA: NEGATIVE mg/dL
Ketones, ur: NEGATIVE mg/dL
Nitrite: POSITIVE — AB
Protein, ur: 30 mg/dL — AB
RBC / HPF: >50 RBC/hpf (ref 0–5)
Specific Gravity, Urine: 1.012 (ref 1.005–1.030)
WBC, UA: >50 WBC/hpf (ref 0–5)
pH: 5 (ref 5.0–8.0)

## 2023-10-02 MED ORDER — CEPHALEXIN 500 MG PO CAPS: 500.0000 mg | ORAL_CAPSULE | Freq: Four times a day (QID) | ORAL | 0 refills | Status: DC

## 2023-10-02 MED ORDER — CEPHALEXIN 500 MG PO CAPS
500.0000 mg | ORAL_CAPSULE | Freq: Once | ORAL | Status: AC
  Administered 2023-10-02: 500 mg via ORAL
  Filled 2023-10-02: qty 1

## 2023-10-02 NOTE — ED Triage Notes (Signed)
 Pt was at work and noticed possible blood in urine. Endorses pain during urination and urine frequency.Denies N/V/D.

## 2023-10-02 NOTE — ED Provider Notes (Signed)
**Note Edwards via Obfuscation**  [facility] AT [facility] Provider Note   CSN: [id number] Arrival date & time: 10/02/23  1150     History Chief Complaint  Patient presents with   Dysuria    HPI Sergio Edwards is a 43 y.o. male presenting for dysuria.  Approximate 5 days.  Seen urgent care negative study at that time but symptoms have worsened.  Endorses systemic chills. No concern for STI.  Ambulatory tolerating p.o. intake.  Patient's recorded medical, surgical, social, medication list and allergies were reviewed in the Snapshot window as part of the initial history.   Review of Systems   Review of Systems  Constitutional:  Negative for chills and fever.  HENT:  Negative for ear pain and sore throat.   Eyes:  Negative for pain and visual disturbance.  Respiratory:  Negative for cough and shortness of breath.   Cardiovascular:  Negative for chest pain and palpitations.  Gastrointestinal:  Negative for abdominal pain and vomiting.  Genitourinary:  Positive for dysuria, frequency and hematuria.  Musculoskeletal:  Negative for arthralgias and back pain.  Skin:  Negative for color change and rash.  Neurological:  Negative for seizures and syncope.  All other systems reviewed and are negative.   Physical Exam Updated Vital Signs BP (!) 130/93   Pulse 76   Temp 98.1 F (36.7 C) (Oral)   Resp 16   Ht 5\' 7"  (1.702 m)   Wt 59 kg   SpO2 96%   BMI 20.36 kg/m  Physical Exam Vitals and nursing note reviewed.  Constitutional:      General: He is not in acute distress.    Appearance: He is well-developed.  HENT:     Head: Normocephalic and atraumatic.  Eyes:     Conjunctiva/sclera: Conjunctivae normal.  Cardiovascular:     Rate and Rhythm: Normal rate and regular rhythm.     Heart sounds: No murmur heard. Pulmonary:     Effort: Pulmonary effort is normal. No respiratory distress.     Breath sounds: Normal breath sounds.  Abdominal:     Palpations: Abdomen is soft.      Tenderness: There is no abdominal tenderness.  Musculoskeletal:        General: No swelling.     Cervical back: Neck supple.  Skin:    General: Skin is warm and dry.     Capillary Refill: Capillary refill takes less than 2 seconds.  Neurological:     Mental Status: He is alert.  Psychiatric:        Mood and Affect: Mood normal.      ED Course/ Medical Decision Making/ A&P    Procedures Procedures   Medications Ordered in ED Medications  cephALEXin  (KEFLEX ) capsule 500 mg (has no administration in time range)   Medical Decision Making:   Sergio Edwards is a 43 y.o. male who presented to the ED today with urinary pain, detailed above.    Patient placed on continuous vitals and telemetry monitoring while in ED which was reviewed periodically.  Complete initial physical exam performed, notably the patient  was HDS in NAD.     Reviewed and confirmed nursing documentation for past medical history, family history, social history.    Initial Assessment:   With the patient's presentation of flank pain, most likely diagnosis is Urologic pathology (including UL vs UTI vs pyelo) vs MSK etiology. Other diagnoses were considered including (but not limited to) gastroenteritis, colitis, small bowel obstruction, appendicitis, cholecystitis, pancreatitis,  testicular  torsion. These are considered less likely due to history of present illness and physical exam findings.   This is most consistent with an acute life/limb threatening illness complicated by underlying chronic conditions.   Initial Plan:  CBC/ Metabolic Panel  to evaluate for underlying infectious/metabolic etiology for patient's abdominal pain  CT Ab/pelvis without contrast due to favored urologic over GI etiology for patient's abdominal pain  Urinalysis and repeat physical assessment to evaluate for UTI/Pyelonpehritis  Empiric management of symptoms with escalating pain control and antiemetics as needed.   Initial Study  Results:   Laboratory  All laboratory results reviewed without evidence of clinically relevant pathology.   Exceptions include: Bacteria, positive nitrates, leukocyturia   Radiology All images reviewed independently. Agree with radiology report at this time.   CT Renal Stone Study Result Date: 10/02/2023 CLINICAL DATA:  Abdominal pain, stone suspected EXAM: CT ABDOMEN AND PELVIS WITHOUT CONTRAST TECHNIQUE: Multidetector CT imaging of the abdomen and pelvis was performed following the standard protocol without IV contrast. RADIATION DOSE REDUCTION: This exam was performed according to the departmental dose-optimization program which includes automated exposure control, adjustment of the mA and/or kV according to patient size and/or use of iterative reconstruction technique. COMPARISON:  None Available. FINDINGS: Lower chest: Lung bases are clear. Hepatobiliary: No focal hepatic lesion on noncontrast exam. Gallbladder normal. Pancreas: Pancreas is normal. No ductal dilatation. No pancreatic inflammation. Spleen: Normal spleen Adrenals/urinary tract: Adrenal glands and kidneys are normal. The ureters and bladder normal. Stomach/Bowel: Stomach, small bowel, appendix, and cecum are normal. Multiple diverticula of the descending colon and sigmoid colon without acute inflammation. Vascular/Lymphatic: Abdominal aorta is normal caliber. No periportal or retroperitoneal adenopathy. No pelvic adenopathy. Reproductive: Insert clear prostate Other: No free fluid. Musculoskeletal: No aggressive osseous lesion. IMPRESSION: 1. No renal stones. No obstructive uropathy. 2. Normal appendix. 3. Diverticulosis without diverticulitis. Electronically Signed   By: Deboraha Fallow M.D.   On: 10/02/2023 18:10   Final Reassessment and Plan:   Evidence of UTI on studies.  Will treat with cephalexin  3 times daily follow-up with PCP.  Urine culture sent to lab. Disposition:  I have considered need for hospitalization, however,  considering all of the above, I believe this patient is stable for discharge at this time.  Patient/family educated about specific return precautions for given chief complaint and symptoms.  Patient/family educated about follow-up with PCP.     Patient/family expressed understanding of return precautions and need for follow-up. Patient spoken to regarding all imaging and laboratory results and appropriate follow up for these results. All education provided in verbal form with additional information in written form. Time was allowed for answering of patient questions. Patient discharged.    Emergency Department Medication Summary:   Medications  cephALEXin  (KEFLEX ) capsule 500 mg (has no administration in time range)           Clinical Impression:  1. Lower urinary tract infectious disease      Discharge   Final Clinical Impression(s) / ED Diagnoses Final diagnoses:  Lower urinary tract infectious disease    Rx / DC Orders ED Discharge Orders          Ordered    cephALEXin  (KEFLEX ) 500 MG capsule  4 times daily        10/02/23 1825              Onetha Bile, MD 10/02/23 1825

## 2023-10-02 NOTE — ED Provider Triage Note (Signed)
Emergency Medicine Provider Triage Evaluation Note  Sergio Edwards , a 43 y.o. male  was evaluated in triage.  Pt complains of 1 day Hx of hematuria with associated intermittent R back pain radiates to groin. Has had a 1 month of lower abdominal pain.  Sexually active w/ 1 male.  Denies Hx of Kidney stones Review of Systems  Positive: red penile discharge, dysuria. Negative: Fevers, chills, chest pain, shortness of breath, n/v/d, rashes, lesions.  Physical Exam  BP (!) 150/97 (BP Location: Left Arm)   Pulse 95   Temp 99 F (37.2 C) (Oral)   Resp 16   Ht 5\' 7"  (1.702 m)   Wt 59 kg   SpO2 100%   BMI 20.36 kg/m  Gen:   Awake, no distress   Resp:  Normal effort  MSK:   Moves extremities without difficulty  Other:    Medical Decision Making  Medically screening exam initiated at 12:57 PM.  Appropriate orders placed.  Sergio Edwards was informed that the remainder of the evaluation will be completed by another provider, this initial triage assessment does not replace that evaluation, and the importance of remaining in the ED until their evaluation is complete.     Sergio Edwards, [place] 10/02/23 1302

## 2023-10-03 ENCOUNTER — Telehealth (HOSPITAL_COMMUNITY): Payer: Self-pay | Admitting: Emergency Medicine

## 2023-10-03 MED ORDER — CEPHALEXIN 500 MG PO CAPS: 500.0000 mg | ORAL_CAPSULE | Freq: Four times a day (QID) | ORAL | 0 refills | Status: DC

## 2023-10-03 NOTE — Telephone Encounter (Cosign Needed)
Patient called in requesting his medication be sent to a different pharmacy.  states due to insurance reason the previous pharmacy was unable to fill.

## 2023-10-06 LAB — URINE CULTURE: Culture: >=100000 — AB

## 2023-10-07 ENCOUNTER — Telehealth (HOSPITAL_BASED_OUTPATIENT_CLINIC_OR_DEPARTMENT_OTHER): Payer: Self-pay | Admitting: *Deleted

## 2023-10-07 NOTE — Telephone Encounter (Signed)
Post ED Visit - Positive Culture Follow-up  Culture report reviewed by antimicrobial stewardship pharmacist: [facility] []  Enzo Bi, Pharm.D. []  Celedonio Miyamoto, Pharm.D., BCPS AQ-ID []  Garvin Fila, Pharm.D., BCPS [x]  Georgina Pillion, Pharm.D., BCPS []  [place], [address].D., BCPS, AAHIVP []  Estella Husk, Pharm.D., BCPS, AAHIVP []  Lysle Pearl, PharmD, BCPS []  Phillips Climes, PharmD, BCPS []  Agapito Games, PharmD, BCPS []  Verlan Friends, PharmD []  Mervyn Gay, PharmD, BCPS []  Vinnie Level, PharmD  [facility] []  Len Childs, PharmD []  Greer Pickerel, PharmD []  Adalberto Cole, PharmD []  Perlie Gold, Rph []  Lonell Face) Jean Rosenthal, PharmD []  Earl Many, PharmD []  Junita Push, PharmD []  Dorna Leitz, PharmD []  Terrilee Files, PharmD []  Lynann Beaver, PharmD []  Keturah Barre, PharmD []  Loralee Pacas, PharmD []  Bernadene Person, PharmD   Positive urine culture Treated with  cephalexin, organism sensitive to the same and no further patient follow-up is required at this time.  Bing Quarry 10/07/2023, 10:58 AM

## 2023-10-10 DIAGNOSIS — N390 Urinary tract infection, site not specified: Secondary | ICD-10-CM | POA: Diagnosis not present

## 2023-10-10 DIAGNOSIS — R319 Hematuria, unspecified: Secondary | ICD-10-CM | POA: Diagnosis not present

## 2023-10-10 DIAGNOSIS — R809 Proteinuria, unspecified: Secondary | ICD-10-CM | POA: Diagnosis not present

## 2024-04-10 ENCOUNTER — Ambulatory Visit
Admission: EM | Admit: 2024-04-10 | Discharge: 2024-04-10 | Disposition: A | Discharge status: Home / Self Care | Attending: Emergency Medicine | Admitting: Emergency Medicine

## 2024-04-10 ENCOUNTER — Encounter: Payer: Self-pay | Admitting: Emergency Medicine

## 2024-04-10 DIAGNOSIS — R1030 Lower abdominal pain, unspecified: Secondary | ICD-10-CM | POA: Insufficient documentation

## 2024-04-10 DIAGNOSIS — R197 Diarrhea, unspecified: Secondary | ICD-10-CM | POA: Insufficient documentation

## 2024-04-10 DIAGNOSIS — N3000 Acute cystitis without hematuria: Secondary | ICD-10-CM | POA: Insufficient documentation

## 2024-04-10 LAB — POCT URINALYSIS DIP (MANUAL ENTRY)
Bilirubin, UA: NEGATIVE
Glucose, UA: NEGATIVE mg/dL
Ketones, POC UA: NEGATIVE mg/dL
Nitrite, UA: POSITIVE — AB
Protein Ur, POC: 100 mg/dL — AB
Spec Grav, UA: >=1.03 — AB (ref 1.010–1.025)
Urobilinogen, UA: 0.2 U/dL
pH, UA: 6 (ref 5.0–8.0)

## 2024-04-10 MED ORDER — DICYCLOMINE HCL 20 MG PO TABS: 20.0000 mg | ORAL_TABLET | Freq: Two times a day (BID) | ORAL | 0 refills | Status: AC

## 2024-04-10 MED ORDER — CEPHALEXIN 500 MG PO CAPS
500.0000 mg | ORAL_CAPSULE | Freq: Three times a day (TID) | ORAL | 0 refills | Status: AC
Start: 2024-04-10 — End: 2024-04-17

## 2024-04-10 NOTE — Discharge Instructions (Signed)
 Your urine showed evidence of urinary tract infection.  Ensure you are drinking at least 64 ounces of water daily.  Take the Keflex  3 times daily with food for the next 7 days.  We are sending a urine off for culture and we will contact you if we need to modify your antibiotics.  For your abdominal pain, diarrhea and nausea you can take the Bentyl, this will help with spasming.  Follow a bland diet with bananas, rice, toast and applesauce.  I suggest following up with gastroenterology due to your history of colitis and diverticulitis.  Return to clinic for any new or urgent symptoms.

## 2024-04-10 NOTE — ED Provider Notes (Signed)
 [facility]    CSN: [id number] Arrival date & time: 04/10/24  0815      History   Chief Complaint Chief Complaint  Patient presents with   Abdominal Pain    HPI Sergio Edwards is a 43 y.o. male.   Patient presents to clinic over concern of lower abdominal pain, diarrhea, nausea and vomiting that started yesterday morning.  He awoke abruptly around 5 or 6 AM to have an episode of emesis.  Throughout the day yesterday he did have multiple episodes of diarrhea.  Intermittent abdominal pain and cramping.  Has not had any diarrhea last night or this morning.  Has been tolerating p.o. food and fluids.  Brought with him some Gatorade today to clinic to rehydrate.  Denies fevers.  Denies new food intake.  Denies eating out recently.  Did have a few beers and some chicken the night prior to symptoms starting.  Denies dysuria, urgency or frequency.  Is having suprapubic pain.  History of UTIs.  History of colitis and diverticulitis.  Has not been able to follow-up with GI.  The history is provided by the patient and medical records.  Abdominal Pain   History reviewed. No pertinent past medical history.  Patient Active Problem List   Diagnosis Date Noted   Colitis 09/21/2022    History reviewed. No pertinent surgical history.     Home Medications    Prior to Admission medications   Medication Sig Start Date End Date Taking? Authorizing Provider  cephALEXin  (KEFLEX ) 500 MG capsule Take 1 capsule (500 mg total) by mouth 3 (three) times daily for 7 days. 04/10/24 04/17/24 Yes Brevin Mcfadden  N, FNP  dicyclomine (BENTYL) 20 MG tablet Take 1 tablet (20 mg total) by mouth 2 (two) times daily. 04/10/24  Yes Dreama, Sabra Sessler  N, FNP    Family History Family History  Problem Relation Age of Onset   Healthy Mother    Healthy Father     Social History Social History   Tobacco Use   Smoking status: Every Day    Current packs/day: 0.50    Types: Cigarettes    Smokeless tobacco: Never  Vaping Use   Vaping status: Never Used  Substance Use Topics   Alcohol use: Yes    Alcohol/week: 2.0 standard drinks of alcohol    Types: 2 Cans of beer per week    Comment: last alcohol 4pm 07-20-17    Drug use: Not Currently    Types: Cocaine, Marijuana    Comment: 09/17/2023     Allergies   Patient has no known allergies.   Review of Systems Review of Systems  Per HPI  Physical Exam Triage Vital Signs ED Triage Vitals  Encounter Vitals Group     BP 04/10/24 0835 (!) 156/99     Girls Systolic BP Percentile --      Girls Diastolic BP Percentile --      Boys Systolic BP Percentile --      Boys Diastolic BP Percentile --      Pulse Rate 04/10/24 0835 81     Resp 04/10/24 0835 14     Temp 04/10/24 0835 97.8 F (36.6 C)     Temp Source 04/10/24 0835 Oral     SpO2 04/10/24 0835 98 %     Weight --      Height --      Head Circumference --      Peak Flow --      Pain Score 04/10/24 0836  7     Pain Loc --      Pain Education --      Exclude from Growth Chart --    No data found.  Updated Vital Signs BP (!) 156/99 (BP Location: Left Arm)   Pulse 81   Temp 97.8 F (36.6 C) (Oral)   Resp 14   SpO2 98%   Visual Acuity Right Eye Distance:   Left Eye Distance:   Bilateral Distance:    Right Eye Near:   Left Eye Near:    Bilateral Near:     Physical Exam Vitals and nursing note reviewed.  Constitutional:      Appearance: He is well-developed.  HENT:     Head: Normocephalic and atraumatic.     Mouth/Throat:     Mouth: Mucous membranes are moist.  Cardiovascular:     Rate and Rhythm: Normal rate.  Pulmonary:     Effort: Pulmonary effort is normal. No respiratory distress.  Abdominal:     General: Abdomen is flat. Bowel sounds are normal.     Palpations: Abdomen is soft.     Tenderness: There is generalized abdominal tenderness. There is no guarding or rebound.  Skin:    General: Skin is warm and dry.  Neurological:      General: No focal deficit present.     Mental Status: He is alert and oriented to person, place, and time.  Psychiatric:        Mood and Affect: Mood normal.        Behavior: Behavior normal.      UC Treatments / Results  Labs (all labs ordered are listed, but only abnormal results are displayed) Labs Reviewed  POCT URINALYSIS DIP (MANUAL ENTRY) - Abnormal; Notable for the following components:      Result Value   Clarity, UA cloudy (*)    Spec Grav, UA >=1.030 (*)    Blood, UA trace-intact (*)    Protein Ur, POC =100 (*)    Nitrite, UA Positive (*)    Leukocytes, UA Small (1+) (*)    All other components within normal limits  URINE CULTURE    EKG   Radiology No results found.  Procedures Procedures (including critical care time)  Medications Ordered in UC Medications - No data to display  Initial Impression / Assessment and Plan / UC Course  I have reviewed the triage vital signs and the nursing notes.  Pertinent labs & imaging results that were available during my care of the patient were reviewed by me and considered in my medical decision making (see chart for details).  Vitals and triage reviewed, patient is hemodynamically stable.  Abdomen is soft with active bowel sounds, generalized tenderness that seems to be worse in the lower abdominal area.  Without rebound or guarding.  Afebrile.  Low concern for acute abdomen at this time.  Will treat abdominal spasms with Bentyl.  Encouraged GI follow-up.  Did notice a foul odor to his urine the other day.  UA is cloudy, high specific gravity, trace red blood cells, protein, nitrites and leukocytes.  Will send for culture and treat with Keflex .  Staff will contact if treatment modification is needed.  Plan of care, follow-up care return precautions given, no questions at this time.    Final Clinical Impressions(s) / UC Diagnoses   Final diagnoses:  Acute cystitis without hematuria     Discharge Instructions       Your urine showed evidence of urinary tract infection.  Ensure you are drinking at least 64 ounces of water daily.  Take the Keflex  3 times daily with food for the next 7 days.  We are sending a urine off for culture and we will contact you if we need to modify your antibiotics.  For your abdominal pain, diarrhea and nausea you can take the Bentyl, this will help with spasming.  Follow a bland diet with bananas, rice, toast and applesauce.  I suggest following up with gastroenterology due to your history of colitis and diverticulitis.  Return to clinic for any new or urgent symptoms.      ED Prescriptions     Medication Sig Dispense Auth. Provider   cephALEXin  (KEFLEX ) 500 MG capsule Take 1 capsule (500 mg total) by mouth 3 (three) times daily for 7 days. 21 capsule Dreama, Ayansh Feutz  N, FNP   dicyclomine (BENTYL) 20 MG tablet Take 1 tablet (20 mg total) by mouth 2 (two) times daily. 20 tablet Dreama, Jarrod Bodkins  N, FNP      PDMP not reviewed this encounter.   Dreama, Kawhi Diebold  N, [place] 04/10/24 [phone]

## 2024-04-10 NOTE — ED Triage Notes (Signed)
 Pt reports abdominal pain in RLQ/LLQ that started yesterday morning. Notes 4-5 episodes of diarrhea yesterday. Denies N/V and fevers. Pain subsided in the evening, but was just as bad this morning when he woke up. Hx of colitis.

## 2024-04-12 LAB — URINE CULTURE: Culture: >=100000 — AB

## 2024-04-13 ENCOUNTER — Ambulatory Visit: Payer: Self-pay | Admitting: Emergency Medicine
